# Patient Record
Sex: Female | Born: 1975
Health system: Southern US, Community
[De-identification: ages and names within clinical notes are randomized; demographics above are authoritative.]

## PROBLEM LIST (undated history)

## (undated) DIAGNOSIS — Z87442 Personal history of urinary calculi: Secondary | ICD-10-CM

## (undated) DIAGNOSIS — F329 Major depressive disorder, single episode, unspecified: Secondary | ICD-10-CM

## (undated) DIAGNOSIS — K219 Gastro-esophageal reflux disease without esophagitis: Secondary | ICD-10-CM

## (undated) DIAGNOSIS — Z8679 Personal history of other diseases of the circulatory system: Secondary | ICD-10-CM

## (undated) DIAGNOSIS — Z973 Presence of spectacles and contact lenses: Secondary | ICD-10-CM

## (undated) DIAGNOSIS — G43909 Migraine, unspecified, not intractable, without status migrainosus: Secondary | ICD-10-CM

## (undated) DIAGNOSIS — E559 Vitamin D deficiency, unspecified: Secondary | ICD-10-CM

## (undated) DIAGNOSIS — N201 Calculus of ureter: Secondary | ICD-10-CM

## (undated) DIAGNOSIS — K5909 Other constipation: Secondary | ICD-10-CM

## (undated) DIAGNOSIS — F32A Depression, unspecified: Secondary | ICD-10-CM

## (undated) DIAGNOSIS — R3915 Urgency of urination: Secondary | ICD-10-CM

---

## 2002-04-26 ENCOUNTER — Emergency Department (HOSPITAL_COMMUNITY): Admission: EM | Admit: 2002-04-26 | Discharge: 2002-04-26 | Payer: Self-pay | Admitting: Emergency Medicine

## 2004-04-14 ENCOUNTER — Emergency Department (HOSPITAL_COMMUNITY): Admission: EM | Admit: 2004-04-14 | Discharge: 2004-04-14 | Payer: Self-pay | Admitting: Family Medicine

## 2005-06-01 ENCOUNTER — Emergency Department (HOSPITAL_COMMUNITY): Admission: EM | Admit: 2005-06-01 | Discharge: 2005-06-01 | Payer: Self-pay | Admitting: Emergency Medicine

## 2006-12-25 ENCOUNTER — Emergency Department (HOSPITAL_COMMUNITY): Admission: EM | Admit: 2006-12-25 | Discharge: 2006-12-25 | Payer: Self-pay | Admitting: Family Medicine

## 2007-09-02 ENCOUNTER — Emergency Department (HOSPITAL_COMMUNITY): Admission: EM | Admit: 2007-09-02 | Discharge: 2007-09-02 | Payer: Self-pay | Admitting: Emergency Medicine

## 2008-12-17 ENCOUNTER — Emergency Department (HOSPITAL_COMMUNITY): Admission: EM | Admit: 2008-12-17 | Discharge: 2008-12-17 | Payer: Self-pay | Admitting: Emergency Medicine

## 2009-04-09 ENCOUNTER — Encounter: Admission: RE | Admit: 2009-04-09 | Discharge: 2009-04-09 | Payer: Self-pay | Admitting: Gastroenterology

## 2010-02-17 ENCOUNTER — Emergency Department (HOSPITAL_COMMUNITY): Admission: EM | Admit: 2010-02-17 | Discharge: 2010-02-17 | Payer: Self-pay | Admitting: Emergency Medicine

## 2010-10-14 ENCOUNTER — Emergency Department (HOSPITAL_COMMUNITY): Admission: EM | Admit: 2010-10-14 | Discharge: 2010-10-14 | Payer: Self-pay | Admitting: Emergency Medicine

## 2011-02-21 LAB — POCT I-STAT, CHEM 8
BUN: 4 mg/dL — ABNORMAL LOW (ref 6–23)
Calcium, Ion: 1.17 mmol/L (ref 1.12–1.32)
Chloride: 105 mEq/L (ref 96–112)
HCT: 42 % (ref 36.0–46.0)
Sodium: 139 mEq/L (ref 135–145)
TCO2: 21 mmol/L (ref 0–100)

## 2011-02-21 LAB — URINALYSIS, ROUTINE W REFLEX MICROSCOPIC
Bilirubin Urine: NEGATIVE
Glucose, UA: NEGATIVE mg/dL
Leukocytes, UA: NEGATIVE
Nitrite: NEGATIVE
Protein, ur: 30 mg/dL — AB
Specific Gravity, Urine: 1.023 (ref 1.005–1.030)
Urobilinogen, UA: 0.2 mg/dL (ref 0.0–1.0)
pH: 6 (ref 5.0–8.0)

## 2011-02-21 LAB — CBC
HCT: 36.6 % (ref 36.0–46.0)
Hemoglobin: 11.7 g/dL — ABNORMAL LOW (ref 12.0–15.0)
MCH: 23.4 pg — ABNORMAL LOW (ref 26.0–34.0)
MCHC: 32 g/dL (ref 30.0–36.0)
MCV: 73.1 fL — ABNORMAL LOW (ref 78.0–100.0)
Platelets: 368 10*3/uL (ref 150–400)
RBC: 5.01 MIL/uL (ref 3.87–5.11)
RDW: 14.8 % (ref 11.5–15.5)
WBC: 5.5 10*3/uL (ref 4.0–10.5)

## 2011-02-21 LAB — GC/CHLAMYDIA PROBE AMP, GENITAL
Chlamydia, DNA Probe: NEGATIVE
GC Probe Amp, Genital: NEGATIVE

## 2011-02-21 LAB — DIFFERENTIAL
Basophils Absolute: 0.1 10*3/uL (ref 0.0–0.1)
Basophils Relative: 1 % (ref 0–1)
Eosinophils Absolute: 0 10*3/uL (ref 0.0–0.7)
Eosinophils Relative: 0 % (ref 0–5)
Lymphocytes Relative: 19 % (ref 12–46)
Lymphs Abs: 1 10*3/uL (ref 0.7–4.0)
Monocytes Absolute: 0.4 10*3/uL (ref 0.1–1.0)
Monocytes Relative: 7 % (ref 3–12)
Neutro Abs: 4 10*3/uL (ref 1.7–7.7)
Neutrophils Relative %: 74 % (ref 43–77)

## 2011-02-21 LAB — WET PREP, GENITAL: Trich, Wet Prep: NONE SEEN

## 2011-02-21 LAB — URINE MICROSCOPIC-ADD ON

## 2011-03-06 LAB — COMPREHENSIVE METABOLIC PANEL
ALT: 18 U/L (ref 0–35)
AST: 25 U/L (ref 0–37)
Albumin: 4.3 g/dL (ref 3.5–5.2)
Alkaline Phosphatase: 39 U/L (ref 39–117)
CO2: 23 mEq/L (ref 19–32)
Chloride: 110 mEq/L (ref 96–112)
GFR calc Af Amer: >60 mL/min (ref >60)
GFR calc non Af Amer: >60 mL/min (ref >60)
Potassium: 3.7 mEq/L (ref 3.5–5.1)
Total Bilirubin: 0.7 mg/dL (ref 0.3–1.2)

## 2011-03-06 LAB — URINALYSIS, ROUTINE W REFLEX MICROSCOPIC
Bilirubin Urine: NEGATIVE
Ketones, ur: 15 mg/dL — AB
Protein, ur: 30 mg/dL — AB
Specific Gravity, Urine: 1.024 (ref 1.005–1.030)
Urobilinogen, UA: 1 mg/dL (ref 0.0–1.0)

## 2011-03-06 LAB — LIPASE, BLOOD: Lipase: 32 U/L (ref 11–59)

## 2011-03-06 LAB — URINE MICROSCOPIC-ADD ON

## 2011-03-06 LAB — DIFFERENTIAL
Basophils Absolute: 0 10*3/uL (ref 0.0–0.1)
Basophils Relative: 0 % (ref 0–1)
Eosinophils Absolute: 0 10*3/uL (ref 0.0–0.7)
Eosinophils Relative: 0 % (ref 0–5)
Monocytes Absolute: 0.2 10*3/uL (ref 0.1–1.0)

## 2011-03-06 LAB — CBC
MCV: 74.1 fL — ABNORMAL LOW (ref 78.0–100.0)
Platelets: 299 10*3/uL (ref 150–400)
RBC: 5 MIL/uL (ref 3.87–5.11)
WBC: 5.4 10*3/uL (ref 4.0–10.5)

## 2011-03-06 LAB — POCT PREGNANCY, URINE: Preg Test, Ur: NEGATIVE

## 2011-03-27 LAB — POCT I-STAT, CHEM 8
Creatinine, Ser: 1 mg/dL (ref 0.4–1.2)
Glucose, Bld: 104 mg/dL — ABNORMAL HIGH (ref 70–99)
HCT: 47 % — ABNORMAL HIGH (ref 36.0–46.0)
Hemoglobin: 16 g/dL — ABNORMAL HIGH (ref 12.0–15.0)
Sodium: 140 mEq/L (ref 135–145)
TCO2: 26 mmol/L (ref 0–100)

## 2011-05-28 ENCOUNTER — Emergency Department (HOSPITAL_COMMUNITY)
Admission: EM | Admit: 2011-05-28 | Discharge: 2011-05-28 | Disposition: A | Discharge status: Home / Self Care | Payer: 59 | Attending: Emergency Medicine | Admitting: Emergency Medicine

## 2011-05-28 DIAGNOSIS — R110 Nausea: Secondary | ICD-10-CM | POA: Insufficient documentation

## 2011-05-28 DIAGNOSIS — F3289 Other specified depressive episodes: Secondary | ICD-10-CM | POA: Insufficient documentation

## 2011-05-28 DIAGNOSIS — R51 Headache: Secondary | ICD-10-CM | POA: Insufficient documentation

## 2011-05-28 DIAGNOSIS — F329 Major depressive disorder, single episode, unspecified: Secondary | ICD-10-CM | POA: Insufficient documentation

## 2011-09-21 LAB — POCT URINALYSIS DIP (DEVICE)
Ketones, ur: NEGATIVE
Protein, ur: 30 — AB
Specific Gravity, Urine: >=1.03
pH: 6

## 2011-09-21 LAB — POCT PREGNANCY, URINE: Operator id: 116391

## 2012-01-02 ENCOUNTER — Other Ambulatory Visit: Payer: Self-pay | Admitting: Neurology

## 2012-01-02 DIAGNOSIS — R410 Disorientation, unspecified: Secondary | ICD-10-CM

## 2012-01-04 ENCOUNTER — Ambulatory Visit
Admission: RE | Admit: 2012-01-04 | Discharge: 2012-01-04 | Disposition: A | Discharge status: Home / Self Care | Payer: 59 | Source: Ambulatory Visit | Attending: Neurology | Admitting: Neurology

## 2012-01-04 ENCOUNTER — Other Ambulatory Visit: Payer: Self-pay | Admitting: Neurosurgery

## 2012-01-04 DIAGNOSIS — S066X9A Traumatic subarachnoid hemorrhage with loss of consciousness of unspecified duration, initial encounter: Secondary | ICD-10-CM

## 2012-01-04 DIAGNOSIS — R410 Disorientation, unspecified: Secondary | ICD-10-CM

## 2013-01-14 ENCOUNTER — Emergency Department (HOSPITAL_COMMUNITY): Payer: 59

## 2013-01-14 ENCOUNTER — Emergency Department (HOSPITAL_COMMUNITY)
Admission: EM | Admit: 2013-01-14 | Discharge: 2013-01-14 | Disposition: A | Discharge status: Home / Self Care | Payer: 59 | Attending: Emergency Medicine | Admitting: Emergency Medicine

## 2013-01-14 ENCOUNTER — Encounter (HOSPITAL_COMMUNITY): Payer: Self-pay | Admitting: Emergency Medicine

## 2013-01-14 DIAGNOSIS — R350 Frequency of micturition: Secondary | ICD-10-CM | POA: Insufficient documentation

## 2013-01-14 DIAGNOSIS — R300 Dysuria: Secondary | ICD-10-CM | POA: Insufficient documentation

## 2013-01-14 DIAGNOSIS — Z3202 Encounter for pregnancy test, result negative: Secondary | ICD-10-CM | POA: Insufficient documentation

## 2013-01-14 DIAGNOSIS — Z8679 Personal history of other diseases of the circulatory system: Secondary | ICD-10-CM | POA: Insufficient documentation

## 2013-01-14 DIAGNOSIS — R3915 Urgency of urination: Secondary | ICD-10-CM | POA: Insufficient documentation

## 2013-01-14 DIAGNOSIS — R011 Cardiac murmur, unspecified: Secondary | ICD-10-CM | POA: Insufficient documentation

## 2013-01-14 DIAGNOSIS — Z79899 Other long term (current) drug therapy: Secondary | ICD-10-CM | POA: Insufficient documentation

## 2013-01-14 DIAGNOSIS — N23 Unspecified renal colic: Secondary | ICD-10-CM | POA: Insufficient documentation

## 2013-01-14 HISTORY — DX: Migraine, unspecified, not intractable, without status migrainosus: G43.909

## 2013-01-14 LAB — CBC WITH DIFFERENTIAL/PLATELET
Basophils Absolute: 0 10*3/uL (ref 0.0–0.1)
HCT: 33.8 % — ABNORMAL LOW (ref 36.0–46.0)
Hemoglobin: 10.7 g/dL — ABNORMAL LOW (ref 12.0–15.0)
Lymphocytes Relative: 27 % (ref 12–46)
Monocytes Absolute: 0.2 10*3/uL (ref 0.1–1.0)
Monocytes Relative: 3 % (ref 3–12)
Neutro Abs: 3.8 10*3/uL (ref 1.7–7.7)
WBC: 5.5 10*3/uL (ref 4.0–10.5)

## 2013-01-14 LAB — COMPREHENSIVE METABOLIC PANEL
AST: 21 U/L (ref 0–37)
Alkaline Phosphatase: 40 U/L (ref 39–117)
BUN: 8 mg/dL (ref 6–23)
CO2: 25 mEq/L (ref 19–32)
Chloride: 99 mEq/L (ref 96–112)
Creatinine, Ser: 0.89 mg/dL (ref 0.50–1.10)
GFR calc non Af Amer: 84 mL/min — ABNORMAL LOW (ref >90)
Potassium: 3.9 mEq/L (ref 3.5–5.1)
Total Bilirubin: 0.7 mg/dL (ref 0.3–1.2)

## 2013-01-14 LAB — URINALYSIS, ROUTINE W REFLEX MICROSCOPIC
Glucose, UA: NEGATIVE mg/dL
pH: 6 (ref 5.0–8.0)

## 2013-01-14 LAB — POCT PREGNANCY, URINE: Preg Test, Ur: NEGATIVE

## 2013-01-14 LAB — URINE MICROSCOPIC-ADD ON

## 2013-01-14 MED ORDER — HYDROCODONE-ACETAMINOPHEN 5-325 MG PO TABS: 1.0000 | ORAL_TABLET | ORAL | Status: DC | PRN

## 2013-01-14 MED ORDER — ONDANSETRON HCL 4 MG/2ML IJ SOLN
4.0000 mg | Freq: Once | INTRAMUSCULAR | Status: AC
  Administered 2013-01-14: 4 mg via INTRAVENOUS
  Filled 2013-01-14: qty 2

## 2013-01-14 MED ORDER — MORPHINE SULFATE 4 MG/ML IJ SOLN
4.0000 mg | Freq: Once | INTRAMUSCULAR | Status: AC
  Administered 2013-01-14: 4 mg via INTRAVENOUS
  Filled 2013-01-14: qty 1

## 2013-01-14 MED ORDER — KETOROLAC TROMETHAMINE 30 MG/ML IJ SOLN
30.0000 mg | Freq: Once | INTRAMUSCULAR | Status: AC
  Administered 2013-01-14: 30 mg via INTRAVENOUS
  Filled 2013-01-14: qty 1

## 2013-01-14 MED ORDER — KETOROLAC TROMETHAMINE 10 MG PO TABS: 10.0000 mg | ORAL_TABLET | Freq: Four times a day (QID) | ORAL | Status: DC | PRN

## 2013-01-14 MED ORDER — SODIUM CHLORIDE 0.9 % IV BOLUS (SEPSIS)
1000.0000 mL | Freq: Once | INTRAVENOUS | Status: AC
  Administered 2013-01-14: 1000 mL via INTRAVENOUS

## 2013-01-14 MED ORDER — ONDANSETRON HCL 4 MG PO TABS: 4.0000 mg | ORAL_TABLET | Freq: Four times a day (QID) | ORAL | Status: DC

## 2013-01-14 MED ORDER — PROMETHAZINE HCL 25 MG/ML IJ SOLN
25.0000 mg | Freq: Once | INTRAMUSCULAR | Status: AC
  Administered 2013-01-14: 25 mg via INTRAVENOUS
  Filled 2013-01-14: qty 1

## 2013-01-14 MED ORDER — TAMSULOSIN HCL 0.4 MG PO CAPS: 0.4000 mg | ORAL_CAPSULE | Freq: Every day | ORAL | Status: DC

## 2013-01-14 NOTE — ED Provider Notes (Signed)
History  This chart was scribed for Cassandra Racer, MD by Shari Heritage, ED Scribe. The patient was seen in room WA08/WA08. Patient's care was started at 67.   CSN: [id number]  Arrival date & time 01/14/13  1932   First MD Initiated Contact with Patient 01/14/13 1953      Chief Complaint  Patient presents with  . Abdominal Pain    Patient is a 37 y.o. female presenting with abdominal pain. The history is provided by the patient. No language interpreter was used.  Abdominal Pain The primary symptoms of the illness include abdominal pain and dysuria. The primary symptoms of the illness do not include vaginal discharge or vaginal bleeding. The current episode started 1 to 2 hours ago. The onset of the illness was sudden. The problem has not changed since onset. The abdominal pain began 1 to 2 hours ago. The pain came on suddenly. The abdominal pain has been unchanged since its onset. The abdominal pain is located in the RLQ and RUQ. The abdominal pain radiates to the groin.   The dysuria began 2 days ago. The dysuria is associated with frequency and urgency. The dysuria is not associated with hematuria.   Additional symptoms associated with the illness include urgency and frequency. Symptoms associated with the illness do not include hematuria.    HPI Comments: Cassandra Mcdaniel is a 37 y.o. female who presents to the Emergency Department complaining of acute onset, constant, moderate to severe, sharp right-sided abdominal pain that radiates to groin. The pain began 2.5 hours ago. Patient also reports associate symptoms of dysuria, urinary frequency and urgency onset 2 days ago. She states that Fentanyl was given en route which improved pain significantly for a short time, but now pain has returned. Patient denies hematuria, vaginal bleeding or vaginal discharge. LMP was 01/08/2013. Patient has a history of kidney stones and has had similar severe abdominal and flank tenderness to palpation. She  says that she has been able to pass stones in the past. No history of appendectomy.   Past Medical History  Diagnosis Date  . Migraine   . Heart murmur     History reviewed. No pertinent past surgical history.  No family history on file.  History  Substance Use Topics  . Smoking status: Never Smoker   . Smokeless tobacco: Never Used  . Alcohol Use: Yes     Comment: Ocassionally     OB History    Grav Para Term Preterm Abortions TAB SAB Ect Mult Living                  Review of Systems  Gastrointestinal: Positive for abdominal pain.  Genitourinary: Positive for dysuria, urgency and frequency. Negative for hematuria, vaginal bleeding and vaginal discharge.  All other systems reviewed and are negative.    Allergies  Review of patient's allergies indicates no known allergies.  Home Medications   Current Outpatient Rx  Name  Route  Sig  Dispense  Refill  . SERTRALINE HCL 100 MG PO TABS   Oral   Take 100 mg by mouth every morning.         Marland Kitchen TEMAZEPAM 30 MG PO CAPS   Oral   Take 30 mg by mouth at bedtime.         . TRAZODONE HCL 100 MG PO TABS   Oral   Take 100 mg by mouth at bedtime.         Marland Kitchen HYDROCODONE-ACETAMINOPHEN 5-325 MG PO TABS  Oral   Take 1 tablet by mouth every 4 (four) hours as needed for pain.   15 tablet   0   . KETOROLAC TROMETHAMINE 10 MG PO TABS   Oral   Take 1 tablet (10 mg total) by mouth every 6 (six) hours as needed for pain.   20 tablet   0   . ONDANSETRON HCL 4 MG PO TABS   Oral   Take 1 tablet (4 mg total) by mouth every 6 (six) hours.   12 tablet   0   . TAMSULOSIN HCL 0.4 MG PO CAPS   Oral   Take 1 capsule (0.4 mg total) by mouth daily.   30 capsule   0     Triage Vitals: BP 130/76  Pulse 63  Temp 98 F (36.7 C)  Resp 20  SpO2 100%  Physical Exam  Constitutional: She is oriented to person, place, and time. She appears well-developed and well-nourished.  HENT:  Head: Normocephalic and atraumatic.   Mouth/Throat: Oropharynx is clear and moist.  Eyes: Conjunctivae normal and EOM are normal. Pupils are equal, round, and reactive to light.  Neck: Normal range of motion. Neck supple.  Cardiovascular: Normal rate, regular rhythm and normal heart sounds.   Pulmonary/Chest: Effort normal and breath sounds normal.  Abdominal: Soft. Bowel sounds are normal. She exhibits no distension. There is tenderness in the right upper quadrant and right lower quadrant. There is no rebound, no guarding and no CVA tenderness.  Musculoskeletal: Normal range of motion.  Neurological: She is alert and oriented to person, place, and time.  Skin: Skin is warm and dry.  Psychiatric: She has a normal mood and affect. Her behavior is normal.    ED Course  Procedures (including critical care time) DIAGNOSTIC STUDIES: Oxygen Saturation is 100% on room air, normal by my interpretation.    COORDINATION OF CARE: 8:12 PM- Patient informed of current plan for treatment and evaluation and agrees with plan at this time.   10:39PM- Patient is comfortable after IV fluids, Phenergan, morphine and Toradol. CT shows 3 mm stone along posterior aspect of bladder right of midline. Patient advised to follow up with urology. She verbalizes understanding and agrees.    Labs Reviewed  URINALYSIS, ROUTINE W REFLEX MICROSCOPIC - Abnormal; Notable for the following:    Hgb urine dipstick MODERATE (*)     Bilirubin Urine MODERATE (*)     Ketones, ur TRACE (*)     Leukocytes, UA TRACE (*)     All other components within normal limits  CBC WITH DIFFERENTIAL - Abnormal; Notable for the following:    Hemoglobin 10.7 (*)     HCT 33.8 (*)     MCV 72.7 (*)     MCH 23.0 (*)     All other components within normal limits  COMPREHENSIVE METABOLIC PANEL - Abnormal; Notable for the following:    Sodium 134 (*)     Glucose, Bld 104 (*)     GFR calc non Af Amer 84 (*)     All other components within normal limits  LIPASE, BLOOD  POCT  PREGNANCY, URINE  URINE MICROSCOPIC-ADD ON  LAB REPORT - SCANNED    No results found.   1. Ureteral colic       MDM  I personally performed the services described in this documentation, which was scribed in my presence. The recorded information has been reviewed and is accurate.    Cassandra Racer, MD 01/17/13 1745

## 2013-01-14 NOTE — ED Notes (Signed)
Per EMS: Pt c/o of right sided 10/10 flank and right lower abdominal pain. Pt reports urinary urgency, however reports difficulty urinating. 150 mcgs of Fentanyl given in route by EMS.

## 2013-01-14 NOTE — ED Notes (Signed)
rx x 4 given for zofran, flomax, hydrocodone and toradol- pt has a ride at bedside

## 2013-01-14 NOTE — ED Notes (Signed)
Bed:WA08<BR> Expected date:<BR> Expected time:<BR> Means of arrival:<BR> Comments:<BR> EMS

## 2013-01-14 NOTE — ED Notes (Signed)
Pt states still does not have the urge to void will check back in 30 mins

## 2013-01-30 ENCOUNTER — Other Ambulatory Visit: Payer: Self-pay | Admitting: Family Medicine

## 2013-01-30 ENCOUNTER — Ambulatory Visit
Admission: RE | Admit: 2013-01-30 | Discharge: 2013-01-30 | Disposition: A | Discharge status: Home / Self Care | Payer: 59 | Source: Ambulatory Visit | Attending: Family Medicine | Admitting: Family Medicine

## 2013-01-30 DIAGNOSIS — R109 Unspecified abdominal pain: Secondary | ICD-10-CM

## 2014-01-05 ENCOUNTER — Ambulatory Visit
Admission: RE | Admit: 2014-01-05 | Discharge: 2014-01-05 | Disposition: A | Discharge status: Home / Self Care | Payer: 59 | Source: Ambulatory Visit | Attending: Family Medicine | Admitting: Family Medicine

## 2014-01-05 ENCOUNTER — Other Ambulatory Visit: Payer: Self-pay | Admitting: Family Medicine

## 2014-01-05 DIAGNOSIS — R1031 Right lower quadrant pain: Secondary | ICD-10-CM

## 2014-01-19 ENCOUNTER — Encounter (HOSPITAL_BASED_OUTPATIENT_CLINIC_OR_DEPARTMENT_OTHER): Payer: Self-pay | Admitting: *Deleted

## 2014-01-19 ENCOUNTER — Other Ambulatory Visit: Payer: Self-pay | Admitting: Urology

## 2014-01-19 NOTE — Progress Notes (Signed)
NPO AFTER MN WITH EXCEPTION CLEAR LIQUIDS UNTIL 0700 (NO CREAM/ MILK PRODUCTS). ARRIVE AT 1145. NEEDS HG AND URINE PREG. WILL TAKE NADOLOL AND BRINTELLIX AM DOS W/ SIPS OF WATER. IF NEEDED MAY TAKE PRN MEDS W/ SIPS OF WATER.

## 2014-01-20 NOTE — H&P (Signed)
History of Present Illness   Ms. Cassandra Mcdaniel presents today as a referral from Dr. Renaye RakersVeita Bland and also to reestablish as a new patient. I saw her on one occasion in 2011 after she had spontaneously passed her first ureteral calculus. At that time she had no family history of nephrolithiasis and that was her first clinical stone event. She had spontaneously passed a 3 mm stone and she had no other renal calculi at that time. Stone analysis turned out to be calcium oxalate and we made some general recommendations about stone prevention. Unfortunately, she has continued to have some intermittent stones and believes she has passed an additional 3-4 stones since that visit. More recently she developed some significant right-sided renal colic along with significant nausea.  A stone protocol CT scan was performed on January 05, 2014. This showed fairly significant right-sided hydronephrosis and a large stone/cluster of stones in her distal right ureter. The lead stone appeared to be about 7 mm in size. In addition, she has a couple of very small stones in both kidneys, none that appear to be bigger than 2 mm. She has continued to have discomfort as well as some nausea. Urinalysis fortunately does not show anything of concern.      Past Medical History Problems  1. History of Anxiety (300.00) 2. History of cardiac murmur (V12.59) 3. History of depression (V11.8) 4. History of gastroesophageal reflux (GERD) (V12.79) 5. History of hypercholesterolemia (V12.29) 6. History of migraine headaches (V12.49)  Surgical History Problems  1. History of No Surgical Problems  Current Meds 1. Ambien TABS;  Therapy: (Recorded:15Nov2011) to Recorded 2. Atenolol TABS;  Therapy: (Recorded:15Nov2011) to Recorded 3. Brintellix 20 MG Oral Tablet;  Therapy: (Recorded:06Feb2015) to Recorded 4. Folic Acid TABS;  Therapy: (Recorded:06Feb2015) to Recorded 5. Nadolol 80 MG Oral Tablet;  Therapy: (Recorded:06Feb2015) to  Recorded 6. TiZANidine HCl - 4 MG Oral Tablet;  Therapy: (Recorded:06Feb2015) to Recorded 7. TiZANidine HCl TABS;  Therapy: (Recorded:15Nov2011) to Recorded 8. Zoloft TABS;  Therapy: (Recorded:15Nov2011) to Recorded  Allergies Medication  1. Imitrex  Family History Problems  1. No pertinent family history : Mother  Social History Problems    Alcohol use   Caffeine use (V49.89)   Never a smoker (V49.89)   Single  Review of Systems Genitourinary, constitutional, skin, eye, otolaryngeal, hematologic/lymphatic, cardiovascular, pulmonary, endocrine, musculoskeletal, gastrointestinal, neurological and psychiatric system(s) were reviewed and pertinent findings if present are noted.  Genitourinary: urinary frequency and nocturia.  Gastrointestinal: nausea, flank pain, abdominal pain and constipation.  Constitutional: feeling tired (fatigue).  Integumentary: skin rash/lesion and pruritus.  Hematologic/Lymphatic: a tendency to easily bruise.  Neurological: dizziness and headache.  Psychiatric: depression and anxiety.    Vitals Vital Signs [Data Includes: Last 1 Day]  Recorded: 06Feb2015 02:55PM  Height: 5 ft 1.5 in Weight: 138 lb  BMI Calculated: 25.65 BSA Calculated: 1.62 Blood Pressure: 111 / 72 Temperature: 97 F Heart Rate: 57  Physical Exam Constitutional: Well nourished and well developed . No acute distress.  ENT:. The ears and nose are normal in appearance.  Neck: The appearance of the neck is normal and no neck mass is present.  Pulmonary: No respiratory distress and normal respiratory rhythm and effort.  Cardiovascular: Heart rate and rhythm are normal . No peripheral edema.  Abdomen: The abdomen is soft and nontender. No CVA tenderness.  Skin: Normal skin turgor, no visible rash and no visible skin lesions.  Neuro/Psych:. Mood and affect are appropriate.    Results/Data Urine [Data Includes: Last  1 Day]   06Feb2015  COLOR YELLOW   APPEARANCE CLEAR    SPECIFIC GRAVITY 1.025   pH 6.0   GLUCOSE NEG mg/dL  BILIRUBIN NEG   KETONE NEG mg/dL  BLOOD TRACE   PROTEIN NEG mg/dL  UROBILINOGEN 0.2 mg/dL  NITRITE NEG   LEUKOCYTE ESTERASE NEG   SQUAMOUS EPITHELIAL/HPF FEW   WBC 3-6 WBC/hpf  RBC 0-2 RBC/hpf  BACTERIA FEW   CRYSTALS NONE SEEN   CASTS Hyaline casts noted   Other MUCUS NOTED    Assessment Assessed  1. Calculus of ureter (592.1) 2. Nephrolithiasis (592.0) 3. Pyuria (791.9)  Plan Health Maintenance  1. UA With REFLEX; [Do Not Release]; Status:Complete;   Done: 06Feb2015 02:41PM Nephrolithiasis  2. Start: Ciprofloxacin HCl - 250 MG Oral Tablet; Take 1 tablet daily 3. Start: Fluconazole 150 MG Oral Tablet; TAKE 1 TABLET 1 TIME ONLY 4. Start: Tamsulosin HCl - 0.4 MG Oral Capsule; TAKE 1 CAPSULE EVERY DAY 5. Follow-up Schedule Surgery Office  Follow-up  Status: Complete  Done: 06Feb2015  Discussion/Summary   Cassandra Mcdaniel has a 7 mm distal right ureteral calculus/possible calculi. She did develop some increased bladder symptoms 2 or 3 weeks ago and I suspect she has had this stone for a number of weeks. Because of ongoing intermittent nausea and pain, she is interested in having something done definitive, and I think that makes a lot of sense in this situation. Given her situation, I would recommend ureteroscopy with Holmium laser lithotripsy. That procedure, risks, benefits discussed with her. We will attempt to try to get her on the schedule for next week. I would anticipate probable need for double-J stent for a few days after the procedure. The chances of success should be 95%. I am going to put her on a one-a-day antibiotic to prevent infection, given the degree of hydronephrosis of the right kidney. We will put her on tamsulosin in hopes that she can pass the stone spontaneously, but I think her chances are probably no better than 20-30%. She has had stone analysis in the past, but I think given her young age and now recurrent  stone, she really would benefit from a 48 hour metabolic panel/profile. We will set that up once the acute stone event has been dealt with successfully.   cc: Renaye Rakers, MD     Signatures Electronically signed by : Barron Alvine, M.D.; Jan 16 2014  4:38PM EST

## 2014-01-21 ENCOUNTER — Encounter (HOSPITAL_BASED_OUTPATIENT_CLINIC_OR_DEPARTMENT_OTHER): Payer: Self-pay | Admitting: Anesthesiology

## 2014-01-21 ENCOUNTER — Encounter (HOSPITAL_BASED_OUTPATIENT_CLINIC_OR_DEPARTMENT_OTHER): Payer: 59 | Admitting: Anesthesiology

## 2014-01-21 ENCOUNTER — Encounter (HOSPITAL_BASED_OUTPATIENT_CLINIC_OR_DEPARTMENT_OTHER)
Admission: RE | Disposition: A | Discharge status: Home / Self Care | Payer: Self-pay | Source: Ambulatory Visit | Attending: Urology

## 2014-01-21 ENCOUNTER — Ambulatory Visit (HOSPITAL_BASED_OUTPATIENT_CLINIC_OR_DEPARTMENT_OTHER)
Admission: RE | Admit: 2014-01-21 | Discharge: 2014-01-21 | Disposition: A | Discharge status: Home / Self Care | Payer: 59 | Source: Ambulatory Visit | Attending: Urology | Admitting: Urology

## 2014-01-21 ENCOUNTER — Ambulatory Visit (HOSPITAL_BASED_OUTPATIENT_CLINIC_OR_DEPARTMENT_OTHER): Payer: 59 | Admitting: Anesthesiology

## 2014-01-21 DIAGNOSIS — N200 Calculus of kidney: Secondary | ICD-10-CM | POA: Insufficient documentation

## 2014-01-21 DIAGNOSIS — R82998 Other abnormal findings in urine: Secondary | ICD-10-CM | POA: Insufficient documentation

## 2014-01-21 DIAGNOSIS — N201 Calculus of ureter: Secondary | ICD-10-CM | POA: Insufficient documentation

## 2014-01-21 DIAGNOSIS — Z79899 Other long term (current) drug therapy: Secondary | ICD-10-CM | POA: Insufficient documentation

## 2014-01-21 DIAGNOSIS — E7800 Pure hypercholesterolemia, unspecified: Secondary | ICD-10-CM | POA: Insufficient documentation

## 2014-01-21 DIAGNOSIS — K219 Gastro-esophageal reflux disease without esophagitis: Secondary | ICD-10-CM | POA: Insufficient documentation

## 2014-01-21 HISTORY — DX: Presence of spectacles and contact lenses: Z97.3

## 2014-01-21 HISTORY — DX: Major depressive disorder, single episode, unspecified: F32.9

## 2014-01-21 HISTORY — DX: Personal history of urinary calculi: Z87.442

## 2014-01-21 HISTORY — PX: HOLMIUM LASER APPLICATION: SHX5852

## 2014-01-21 HISTORY — DX: Depression, unspecified: F32.A

## 2014-01-21 HISTORY — DX: Vitamin D deficiency, unspecified: E55.9

## 2014-01-21 HISTORY — PX: CYSTOSCOPY WITH RETROGRADE PYELOGRAM, URETEROSCOPY AND STENT PLACEMENT: SHX5789

## 2014-01-21 HISTORY — DX: Urgency of urination: R39.15

## 2014-01-21 HISTORY — DX: Calculus of ureter: N20.1

## 2014-01-21 HISTORY — DX: Gastro-esophageal reflux disease without esophagitis: K21.9

## 2014-01-21 HISTORY — DX: Personal history of other diseases of the circulatory system: Z86.79

## 2014-01-21 HISTORY — DX: Other constipation: K59.09

## 2014-01-21 LAB — POCT PREGNANCY, URINE: PREG TEST UR: NEGATIVE

## 2014-01-21 LAB — POCT HEMOGLOBIN-HEMACUE: Hemoglobin: 10 g/dL — ABNORMAL LOW (ref 12.0–15.0)

## 2014-01-21 SURGERY — CYSTOURETEROSCOPY, WITH RETROGRADE PYELOGRAM AND STENT INSERTION
Anesthesia: General | Site: Ureter | Laterality: Right

## 2014-01-21 MED ORDER — NITROFURANTOIN MACROCRYSTAL 100 MG PO CAPS: 100.0000 mg | ORAL_CAPSULE | Freq: Every day | ORAL | Status: DC

## 2014-01-21 MED ORDER — PROPOFOL 10 MG/ML IV BOLUS
INTRAVENOUS | Status: DC | PRN
  Administered 2014-01-21: 160 mg via INTRAVENOUS

## 2014-01-21 MED ORDER — FENTANYL CITRATE 0.05 MG/ML IJ SOLN
INTRAMUSCULAR | Status: AC
  Filled 2014-01-21: qty 2

## 2014-01-21 MED ORDER — LACTATED RINGERS IV SOLN
INTRAVENOUS | Status: DC
  Administered 2014-01-21: 12:00:00 via INTRAVENOUS
  Filled 2014-01-21: qty 1000

## 2014-01-21 MED ORDER — CEFAZOLIN SODIUM 1-5 GM-% IV SOLN
INTRAVENOUS | Status: AC
  Filled 2014-01-21: qty 50

## 2014-01-21 MED ORDER — URELLE 81 MG PO TABS
ORAL_TABLET | ORAL | Status: AC
  Filled 2014-01-21: qty 1

## 2014-01-21 MED ORDER — IOHEXOL 350 MG/ML SOLN
INTRAVENOUS | Status: DC | PRN
  Administered 2014-01-21: 5 mL

## 2014-01-21 MED ORDER — CEFAZOLIN SODIUM-DEXTROSE 2-3 GM-% IV SOLR
2.0000 g | INTRAVENOUS | Status: AC
  Administered 2014-01-21: 2 g via INTRAVENOUS
  Filled 2014-01-21: qty 50

## 2014-01-21 MED ORDER — FENTANYL CITRATE 0.05 MG/ML IJ SOLN
INTRAMUSCULAR | Status: DC | PRN
  Administered 2014-01-21: 50 ug via INTRAVENOUS

## 2014-01-21 MED ORDER — ACETAMINOPHEN 10 MG/ML IV SOLN
INTRAVENOUS | Status: DC | PRN
  Administered 2014-01-21: 1000 mg via INTRAVENOUS

## 2014-01-21 MED ORDER — URELLE 81 MG PO TABS
1.0000 | ORAL_TABLET | Freq: Four times a day (QID) | ORAL | Status: DC
  Administered 2014-01-21: 81 mg via ORAL
  Filled 2014-01-21: qty 1

## 2014-01-21 MED ORDER — HYDROCODONE-ACETAMINOPHEN 5-325 MG PO TABS: 1.0000 | ORAL_TABLET | Freq: Four times a day (QID) | ORAL | Status: DC | PRN

## 2014-01-21 MED ORDER — URIBEL 118 MG PO CAPS: 1.0000 | ORAL_CAPSULE | Freq: Three times a day (TID) | ORAL | Status: DC | PRN

## 2014-01-21 MED ORDER — DEXAMETHASONE SODIUM PHOSPHATE 4 MG/ML IJ SOLN
INTRAMUSCULAR | Status: DC | PRN
  Administered 2014-01-21: 10 mg via INTRAVENOUS

## 2014-01-21 MED ORDER — FENTANYL CITRATE 0.05 MG/ML IJ SOLN
25.0000 ug | INTRAMUSCULAR | Status: DC | PRN
  Filled 2014-01-21: qty 1

## 2014-01-21 MED ORDER — CEFAZOLIN SODIUM 1-5 GM-% IV SOLN
1.0000 g | INTRAVENOUS | Status: DC
  Filled 2014-01-21: qty 50

## 2014-01-21 MED ORDER — MIDAZOLAM HCL 5 MG/5ML IJ SOLN
INTRAMUSCULAR | Status: DC | PRN
  Administered 2014-01-21: 2 mg via INTRAVENOUS

## 2014-01-21 MED ORDER — MIDAZOLAM HCL 2 MG/2ML IJ SOLN
INTRAMUSCULAR | Status: AC
  Filled 2014-01-21: qty 2

## 2014-01-21 MED ORDER — ONDANSETRON HCL 4 MG/2ML IJ SOLN
INTRAMUSCULAR | Status: DC | PRN
  Administered 2014-01-21: 4 mg via INTRAVENOUS

## 2014-01-21 MED ORDER — KETOROLAC TROMETHAMINE 30 MG/ML IJ SOLN
INTRAMUSCULAR | Status: DC | PRN
  Administered 2014-01-21: 30 mg via INTRAVENOUS

## 2014-01-21 MED ORDER — BELLADONNA ALKALOIDS-OPIUM 16.2-60 MG RE SUPP
RECTAL | Status: AC
  Filled 2014-01-21: qty 1

## 2014-01-21 MED ORDER — LIDOCAINE HCL (CARDIAC) 20 MG/ML IV SOLN
INTRAVENOUS | Status: DC | PRN
  Administered 2014-01-21: 60 mg via INTRAVENOUS

## 2014-01-21 MED ORDER — SODIUM CHLORIDE 0.9 % IR SOLN
Status: DC | PRN
  Administered 2014-01-21: 2000 mL

## 2014-01-21 MED ORDER — BELLADONNA ALKALOIDS-OPIUM 16.2-60 MG RE SUPP
RECTAL | Status: DC | PRN
  Administered 2014-01-21: 1 via RECTAL

## 2014-01-21 MED ORDER — HYDROCODONE-ACETAMINOPHEN 5-325 MG PO TABS
1.0000 | ORAL_TABLET | Freq: Four times a day (QID) | ORAL | Status: DC | PRN
  Administered 2014-01-21: 1 via ORAL
  Filled 2014-01-21: qty 2

## 2014-01-21 MED ORDER — LACTATED RINGERS IV SOLN
INTRAVENOUS | Status: DC
Start: 2014-01-21 — End: 2014-01-21
  Administered 2014-01-21: 15:00:00 via INTRAVENOUS
  Filled 2014-01-21: qty 1000

## 2014-01-21 MED ORDER — LIDOCAINE HCL 2 % EX GEL
CUTANEOUS | Status: DC | PRN
  Administered 2014-01-21: 1 via URETHRAL

## 2014-01-21 SURGICAL SUPPLY — 46 items
ADAPTER CATH URET PLST 4-6FR (CATHETERS) IMPLANT
ADPR CATH URET STRL DISP 4-6FR (CATHETERS)
APL SKNCLS STERI-STRIP NONHPOA (GAUZE/BANDAGES/DRESSINGS)
BAG DRAIN URO-CYSTO SKYTR STRL (DRAIN) ×2 IMPLANT
BAG DRN UROCATH (DRAIN) ×1
BASKET LASER NITINOL 1.9FR (BASKET) IMPLANT
BASKET STNLS GEMINI 4WIRE 3FR (BASKET) IMPLANT
BASKET ZERO TIP NITINOL 2.4FR (BASKET) ×2 IMPLANT
BENZOIN TINCTURE PRP APPL 2/3 (GAUZE/BANDAGES/DRESSINGS) IMPLANT
BSKT STON RTRVL 120 1.9FR (BASKET)
BSKT STON RTRVL GEM 120X11 3FR (BASKET)
BSKT STON RTRVL ZERO TP 2.4FR (BASKET) ×1
CANISTER SUCT LVC 12 LTR MEDI- (MISCELLANEOUS) ×1 IMPLANT
CATH INTERMIT  6FR 70CM (CATHETERS) ×1 IMPLANT
CATH URET 5FR 28IN CONE TIP (BALLOONS)
CATH URET 5FR 28IN OPEN ENDED (CATHETERS) IMPLANT
CATH URET 5FR 70CM CONE TIP (BALLOONS) IMPLANT
CLOTH BEACON ORANGE TIMEOUT ST (SAFETY) ×2 IMPLANT
DRAPE CAMERA CLOSED 9X96 (DRAPES) ×2 IMPLANT
DRSG TEGADERM 2-3/8X2-3/4 SM (GAUZE/BANDAGES/DRESSINGS) ×1 IMPLANT
FIBER LASER FLEXIVA 1000 (UROLOGICAL SUPPLIES) IMPLANT
FIBER LASER FLEXIVA 200 (UROLOGICAL SUPPLIES) IMPLANT
FIBER LASER FLEXIVA 365 (UROLOGICAL SUPPLIES) ×1 IMPLANT
FIBER LASER FLEXIVA 550 (UROLOGICAL SUPPLIES) IMPLANT
GLOVE BIO SURGEON STRL SZ7.5 (GLOVE) ×2 IMPLANT
GOWN STRL REIN XL XLG (GOWN DISPOSABLE) ×2 IMPLANT
GOWN STRL REUS W/ TWL LRG LVL3 (GOWN DISPOSABLE) IMPLANT
GOWN STRL REUS W/ TWL XL LVL3 (GOWN DISPOSABLE) IMPLANT
GOWN STRL REUS W/TWL LRG LVL3 (GOWN DISPOSABLE) ×2
GOWN STRL REUS W/TWL XL LVL3 (GOWN DISPOSABLE) ×2
GUIDEWIRE 0.038 PTFE COATED (WIRE) IMPLANT
GUIDEWIRE ANG ZIPWIRE 038X150 (WIRE) IMPLANT
GUIDEWIRE STR DUAL SENSOR (WIRE) ×1 IMPLANT
IV NS 1000ML (IV SOLUTION) ×4
IV NS 1000ML BAXH (IV SOLUTION) IMPLANT
IV NS IRRIG 3000ML ARTHROMATIC (IV SOLUTION) ×2 IMPLANT
KIT BALLIN UROMAX 15FX10 (LABEL) IMPLANT
KIT BALLN UROMAX 15FX4 (MISCELLANEOUS) IMPLANT
KIT BALLN UROMAX 26 75X4 (MISCELLANEOUS)
NS IRRIG 500ML POUR BTL (IV SOLUTION) IMPLANT
PACK CYSTOSCOPY (CUSTOM PROCEDURE TRAY) ×2 IMPLANT
SET HIGH PRES BAL DIL (LABEL)
SHEATH ACCESS URETERAL 38CM (SHEATH) IMPLANT
SHEATH ACCESS URETERAL 54CM (SHEATH) IMPLANT
SHEATH URET ACCESS 12FR/35CM (UROLOGICAL SUPPLIES) ×1 IMPLANT
STENT 6X24 (STENTS) ×1 IMPLANT

## 2014-01-21 NOTE — Anesthesia Postprocedure Evaluation (Signed)
  Anesthesia Post-op Note  Patient: Cassandra Mcdaniel  Procedure(s) Performed: Procedure(s) (LRB): CYSTOSCOPY WITH RETROGRADE PYELOGRAM, URETEROSCOPY AND  STENT PLACEMENT (Right) HOLMIUM LASER APPLICATION (Right)  Patient Location: PACU  Anesthesia Type: General  Level of Consciousness: awake and alert   Airway and Oxygen Therapy: Patient Spontanous Breathing  Post-op Pain: mild  Post-op Assessment: Post-op Vital signs reviewed, Patient's Cardiovascular Status Stable, Respiratory Function Stable, Patent Airway and No signs of Nausea or vomiting  Last Vitals:  Filed Vitals:   01/21/14 1608  BP: 118/74  Pulse: 64  Temp: 36 C  Resp: 16    Post-op Vital Signs: stable   Complications: No apparent anesthesia complications

## 2014-01-21 NOTE — Transfer of Care (Signed)
Immediate Anesthesia Transfer of Care Note  Patient: Cassandra PartridgeQuiana N Mcdaniel  Procedure(s) Performed: Procedure(s): CYSTOSCOPY WITH RETROGRADE PYELOGRAM, URETEROSCOPY AND POSSIBLE STENT PLACEMENT (Right) HOLMIUM LASER APPLICATION (Right)  Patient Location: PACU  Anesthesia Type:General  Level of Consciousness: sedated and responds to stimulation  Airway & Oxygen Therapy: Patient Spontanous Breathing and Patient connected to nasal cannula oxygen  Post-op Assessment: Report given to PACU RN  Post vital signs: Reviewed and stable  Complications: No apparent anesthesia complications

## 2014-01-21 NOTE — Anesthesia Preprocedure Evaluation (Addendum)
Anesthesia Evaluation  Patient identified by MRN, date of birth, ID band Patient awake    Reviewed: Allergy & Precautions, H&P , NPO status , Patient's Chart, lab work & pertinent test results  Airway Mallampati: II  TM Distance: >3 FB Neck ROM: full    Dental no notable dental hx. (+) Teeth Intact, Dental Advisory Given   Pulmonary neg pulmonary ROS,  breath sounds clear to auscultation  Pulmonary exam normal       Cardiovascular Exercise Tolerance: Good negative cardio ROS  Rhythm:regular Rate:Normal     Neuro/Psych negative neurological ROS  negative psych ROS   GI/Hepatic negative GI ROS, Neg liver ROS,   Endo/Other  negative endocrine ROS  Renal/GU negative Renal ROS  negative genitourinary   Musculoskeletal   Abdominal   Peds  Hematology negative hematology ROS (+)   Anesthesia Other Findings   Reproductive/Obstetrics negative OB ROS                             Anesthesia Physical Anesthesia Plan  ASA: I  Anesthesia Plan: General   Post-op Pain Management:    Induction: Intravenous  Airway Management Planned: LMA  Additional Equipment:   Intra-op Plan:   Post-operative Plan:   Informed Consent: I have reviewed the patients History and Physical, chart, labs and discussed the procedure including the risks, benefits and alternatives for the proposed anesthesia with the patient or authorized representative who has indicated his/her understanding and acceptance.   Dental Advisory Given  Plan Discussed with: CRNA and Surgeon  Anesthesia Plan Comments:         Anesthesia Quick Evaluation  

## 2014-01-21 NOTE — Anesthesia Procedure Notes (Signed)
Procedure Name: LMA Insertion Date/Time: 01/21/2014 1:28 PM Performed by: Cassandra Mcdaniel, Cassandra Mcdaniel Pre-anesthesia Checklist: Patient identified, Emergency Drugs available, Suction available and Patient being monitored Patient Re-evaluated:Patient Re-evaluated prior to inductionOxygen Delivery Method: Circle System Utilized Preoxygenation: Pre-oxygenation with 100% oxygen Intubation Type: IV induction Ventilation: Mask ventilation without difficulty LMA: LMA inserted LMA Size: 4.0 Number of attempts: 1 Airway Equipment and Method: bite block Placement Confirmation: positive ETCO2 Dental Injury: Teeth and Oropharynx as per pre-operative assessment

## 2014-01-21 NOTE — Op Note (Signed)
Preoperative diagnosis: right ureteral calculus  Postoperative diagnosis: right ureteral calculus  Procedure:  1. Cystoscopy 2. right  ureteroscopy and stone removal 3. Ureteroscopic laser lithotripsy 4. right  ureteral stent placement (24) 42F 5. right  retrograde pyelography with interpretation  Surgeon: Valetta Fulleravid S. Winna Golla, MD  Anesthesia: General  Complications: None  Intraoperative findings: right  retrograde pyelography demonstrated a filling defect within the right  ureter consistent with the patient's known calculus without other abnormalities.  EBL: Minimal  Specimens: 1. right  ureteral calculus  Disposition of specimens: [facility] for stone analysis  Indication: Cassandra Mcdaniel is a 38 y.o.   patient with urolithiasis. After reviewing the management options for treatment, the patient elected to proceed with the above surgical procedure(s). We have discussed the potential benefits and risks of the procedure, side effects of the proposed treatment, the likelihood of the patient achieving the goals of the procedure, and any potential problems that might occur during the procedure or recuperation. Informed consent has been obtained.  Description of procedure:  The patient was taken to the operating room and general anesthesia was induced.  The patient was placed in the dorsal lithotomy position, prepped and draped in the usual sterile fashion, and preoperative antibiotics were administered. A preoperative time-out was performed.   Cystourethroscopy was performed. The bladder was  systematically examined in its entirety. There was no evidence for any bladder tumors, stones, or other mucosal pathology.    Attention then turned to the right  ureteral orifice and a ureteral catheter was used to intubate the ureteral orifice.  Omnipaque contrast was injected through the ureteral catheter and a retrograde pyelogram was performed with findings as dictated above.  A  0.38 sensor guidewire was then advanced up the right  ureter into the renal pelvis under fluoroscopic guidance. The 6 Fr semirigid ureteroscope was then advanced into the ureter next to the guidewire and the calculus was identified.   The stone was then fragmented with the 365 micron holmium laser fiber on a setting of 0.8J and frequency of 8 Hz.   All stones were then removed from the ureter with a zero tip nitinol basket.  Reinspection of the ureter revealed no remaining visible stones or fragments.   The wire was then backloaded through the cystoscope and a ureteral stent was advance over the wire using Seldinger technique.  The stent was positioned appropriately under fluoroscopic and cystoscopic guidance.  The wire was then removed with an adequate stent curl noted in the renal pelvis as well as in the bladder.  The bladder was then emptied and the procedure ended.  The patient appeared to tolerate the procedure well and without complications.  The patient was able to be awakened and transferred to the recovery unit in satisfactory condition.

## 2014-01-21 NOTE — Discharge Instructions (Addendum)
DISCHARGE INSTRUCTIONS FOR KIDNEY STONES OR URETERAL STENT  MEDICATIONS:   1. DO NOT RESUME YOUR ASPIRIN, or any other medicines like ibuprofen, motrin, excedrin, advil, aleve, vitamin E, fish oil as these can all cause bleeding x 7 days.  2. Resume all your other meds from home - except do not take any other pain meds that you may have at home.  ACTIVITY 1. No strenuous activity x 1week 2. No driving while on narcotic pain medications 3. Drink plenty of water 4. Continue to walk at home - you can still get blood clots when you are at home, so keep active, but don't over do it. 5. May return to work in 3 days.  BATHING 1. You can shower and we recommend daily showers  2. If you have a string coming from your urethra:  The stent string is attached to your ureteral stent.  Do not pull on this.   SIGNS/SYMPTOMS TO CALL: 1. Please call us if you have a fever greater than 101.5, uncontrolled  nausea/vomiting, uncontrolled pain, dizziness, unable to urinate, bloody urine, chest pain, shortness of breath, leg swelling, leg pain, redness around wound, drainage from wound, or any other concerns or questions.  You can reach us at [phone].  FOLLOW-UP 1. We will call with follow up. 2. You may remove the stent on Saturday  By pulling on the string    Post Anesthesia Home Care Instructions  Activity: Get plenty of rest for the remainder of the day. A responsible adult should stay with you for 24 hours following the procedure.  For the next 24 hours, DO NOT: -Drive a car -Advertising copywriterperate machinery -Drink alcoholic beverages -Take any medication unless instructed by your physician -Make any legal decisions or sign important papers.  Meals: Start with liquid foods such as gelatin or soup. Progress to regular foods as tolerated. Avoid greasy, spicy, heavy foods. If nausea and/or vomiting occur, drink only clear liquids until the nausea and/or vomiting subsides. Call your physician if vomiting  continues.  Special Instructions/Symptoms: Your throat may feel dry or sore from the anesthesia or the breathing tube placed in your throat during surgery. If this causes discomfort, gargle with warm salt water. The discomfort should disappear within 24 hours.

## 2014-01-21 NOTE — Interval H&P Note (Signed)
History and Physical Interval Note:  01/21/2014 1:04 PM  Cassandra PartridgeQuiana N Mcdaniel  has presented today for surgery, with the diagnosis of RIGHT URETERAL CALCULUS  The various methods of treatment have been discussed with the patient and family. After consideration of risks, benefits and other options for treatment, the patient has consented to  Procedure(s): CYSTOSCOPY WITH RETROGRADE PYELOGRAM, URETEROSCOPY AND POSSIBLE STENT PLACEMENT (Right) HOLMIUM LASER APPLICATION (Right) as a surgical intervention .  The patient's history has been reviewed, patient examined, no change in status, stable for surgery.  I have reviewed the patient's chart and labs.  Questions were answered to the patient's satisfaction.     Ally Knodel S

## 2014-01-22 ENCOUNTER — Encounter (HOSPITAL_BASED_OUTPATIENT_CLINIC_OR_DEPARTMENT_OTHER): Payer: Self-pay | Admitting: Urology

## 2018-01-21 ENCOUNTER — Encounter (HOSPITAL_COMMUNITY): Payer: Self-pay | Admitting: Emergency Medicine

## 2018-01-21 ENCOUNTER — Other Ambulatory Visit: Payer: Self-pay

## 2018-01-21 ENCOUNTER — Ambulatory Visit (HOSPITAL_COMMUNITY)
Admission: EM | Admit: 2018-01-21 | Discharge: 2018-01-21 | Disposition: A | Discharge status: Home / Self Care | Payer: 59 | Attending: Internal Medicine | Admitting: Internal Medicine

## 2018-01-21 DIAGNOSIS — Z87442 Personal history of urinary calculi: Secondary | ICD-10-CM

## 2018-01-21 DIAGNOSIS — N23 Unspecified renal colic: Secondary | ICD-10-CM

## 2018-01-21 LAB — POCT I-STAT, CHEM 8
BUN: 10 mg/dL (ref 6–20)
Calcium, Ion: 1.21 mmol/L (ref 1.15–1.40)
Chloride: 102 mmol/L (ref 101–111)
Creatinine, Ser: 0.9 mg/dL (ref 0.44–1.00)
GLUCOSE: 93 mg/dL (ref 65–99)
HCT: 39 % (ref 36.0–46.0)
HEMOGLOBIN: 13.3 g/dL (ref 12.0–15.0)
POTASSIUM: 4 mmol/L (ref 3.5–5.1)
Sodium: 140 mmol/L (ref 135–145)
TCO2: 25 mmol/L (ref 22–32)

## 2018-01-21 LAB — POCT URINALYSIS DIP (DEVICE)
Bilirubin Urine: NEGATIVE
Glucose, UA: NEGATIVE mg/dL
Ketones, ur: NEGATIVE mg/dL
Leukocytes, UA: NEGATIVE
NITRITE: NEGATIVE
PROTEIN: NEGATIVE mg/dL
Specific Gravity, Urine: <=1.005 (ref 1.005–1.030)
Urobilinogen, UA: 0.2 mg/dL (ref 0.0–1.0)
pH: 6 (ref 5.0–8.0)

## 2018-01-21 LAB — POCT PREGNANCY, URINE: PREG TEST UR: NEGATIVE

## 2018-01-21 MED ORDER — HYDROCODONE-ACETAMINOPHEN 5-325 MG PO TABS: 2.0000 | ORAL_TABLET | ORAL | 0 refills | Status: DC | PRN

## 2018-01-21 MED ORDER — KETOROLAC TROMETHAMINE 60 MG/2ML IM SOLN
INTRAMUSCULAR | Status: AC
  Filled 2018-01-21: qty 2

## 2018-01-21 MED ORDER — IBUPROFEN 800 MG PO TABS: 800.0000 mg | ORAL_TABLET | Freq: Three times a day (TID) | ORAL | 0 refills | Status: DC

## 2018-01-21 MED ORDER — TAMSULOSIN HCL 0.4 MG PO CAPS: 0.4000 mg | ORAL_CAPSULE | Freq: Every day | ORAL | 0 refills | Status: DC

## 2018-01-21 MED ORDER — ONDANSETRON HCL 4 MG/2ML IJ SOLN
4.0000 mg | Freq: Once | INTRAMUSCULAR | Status: AC
  Administered 2018-01-21: 4 mg via INTRAMUSCULAR

## 2018-01-21 MED ORDER — ONDANSETRON HCL 4 MG/2ML IJ SOLN
INTRAMUSCULAR | Status: AC
  Filled 2018-01-21: qty 2

## 2018-01-21 MED ORDER — ONDANSETRON HCL 4 MG PO TABS: 4.0000 mg | ORAL_TABLET | Freq: Three times a day (TID) | ORAL | 0 refills | Status: AC | PRN

## 2018-01-21 MED ORDER — KETOROLAC TROMETHAMINE 60 MG/2ML IM SOLN
60.0000 mg | Freq: Once | INTRAMUSCULAR | Status: AC
  Administered 2018-01-21: 60 mg via INTRAMUSCULAR

## 2018-01-21 NOTE — ED Triage Notes (Signed)
intermittent pain for a week.  Patient reports having had left flank pain, but now pain is in right abdomen.  Patient is having difficulty urinating.  Has spoke to "[facility]" but advised to come to ucc.  Patient has a history of kidney stones.

## 2018-01-21 NOTE — ED Provider Notes (Signed)
[facility]    CSN: [id number] Arrival date & time: 01/21/18  1614     History   Chief Complaint Chief Complaint  Patient presents with  . Abdominal Pain    HPI Cassandra Mcdaniel is a 42 y.o. female.   Cassandra Mcdaniel presents with complaints of pain after urinating which developed into left flank pain which comes and goes, as well as right pelvic pain which radiates into the groin. This started approximately 7 days ago and has progressed. Has a history of kidney stones, has required lithotripsy in the past, and states this feels just the same. Intermittent nausea. Pain improves after urination. Yesterday noted chills and sweats with pain. Has not taken any medications for pain today. Yesterday took ibuprofen. Without fevers.   ROS per HPI.       Past Medical History:  Diagnosis Date  . Chronic constipation   . Depression   . H/O cardiac murmur    PT DENIES S & S   . History of kidney stones   . Migraine   . Mild acid reflux    DIET CONTROLLED  . Right ureteral calculus   . Urgency of urination   . Vitamin D deficiency   . Wears contact lenses     Patient Active Problem List   Diagnosis Date Noted  . Ureteral calculi 01/21/2014    Past Surgical History:  Procedure Laterality Date  . CYSTOSCOPY WITH RETROGRADE PYELOGRAM, URETEROSCOPY AND STENT PLACEMENT Right 01/21/2014   Procedure: CYSTOSCOPY WITH RETROGRADE PYELOGRAM, URETEROSCOPY AND  STENT PLACEMENT;  Surgeon: Valetta Fulleravid S Grapey, MD;  [facility] [place];  Service: Urology;  Laterality: Right;  . HOLMIUM LASER APPLICATION Right 01/21/2014   Procedure: HOLMIUM LASER APPLICATION;  Surgeon: Valetta Fulleravid S Grapey, MD;  [facility] [place];  Service: Urology;  Laterality: Right;    OB History    No data available       Home Medications    Prior to Admission medications   Medication Sig Start Date End Date Taking? Authorizing Provider  levETIRAcetam (KEPPRA) 500 MG tablet Take 500  mg by mouth 2 (two) times daily.   Yes [provider]  Cholecalciferol (VITAMIN D3) 50000 UNITS CAPS Take 1 capsule by mouth once a week.    [provider]  folic acid (FOLVITE) 1 MG tablet Take 1 mg by mouth daily.    [provider]  HYDROcodone-acetaminophen (NORCO/VICODIN) 5-325 MG tablet Take 2 tablets by mouth every 4 (four) hours as needed. 01/21/18   Georgetta HaberBurky, Natalie B, NP  ibuprofen (ADVIL,MOTRIN) 800 MG tablet Take 1 tablet (800 mg total) by mouth 3 (three) times daily. 01/21/18   Georgetta HaberBurky, Natalie B, NP  Linaclotide (LINZESS) 290 MCG CAPS capsule Take 290 mcg by mouth every evening.    [provider]  promethazine (PHENERGAN) 25 MG tablet Take 25 mg by mouth every 6 (six) hours as needed for nausea or vomiting.    [provider]  tamsulosin (FLOMAX) 0.4 MG CAPS capsule Take 1 capsule (0.4 mg total) by mouth daily. 01/21/18   Georgetta HaberBurky, Natalie B, NP  tiZANidine (ZANAFLEX) 4 MG capsule Take 4 mg by mouth 3 (three) times daily as needed for muscle spasms.    [provider]    Family History Family History  Problem Relation Age of Onset  . Hyperlipidemia Mother   . Hypertension Mother   . Diabetes Mother   . Heart Problems Father   . Hyperlipidemia Father   .  Hypertension Father     Social History Social History   Tobacco Use  . Smoking status: Never Smoker  . Smokeless tobacco: Never Used  Substance Use Topics  . Alcohol use: Yes    Comment: Ocassionally   . Drug use: No     Allergies   Imitrex [sumatriptan]   Review of Systems Review of Systems   Physical Exam Triage Vital Signs ED Triage Vitals  Enc Vitals Group     BP 01/21/18 1753 (!) 145/87     Pulse Rate 01/21/18 1753 82     Resp 01/21/18 1753 (!) 22     Temp 01/21/18 1753 97.8 F (36.6 C)     Temp Source 01/21/18 1753 Oral     SpO2 01/21/18 1753 100 %     Weight --      Height --      Head Circumference --      Peak Flow --      Pain Score  01/21/18 1749 10     Pain Loc --      Pain Edu? --      Excl. in GC? --    No data found.  Updated Vital Signs BP (!) 145/87 (BP Location: Left Arm)   Pulse 82   Temp 97.8 F (36.6 C) (Oral)   Resp (!) 22   LMP 01/16/2018   SpO2 100%   Visual Acuity Right Eye Distance:   Left Eye Distance:   Bilateral Distance:    Right Eye Near:   Left Eye Near:    Bilateral Near:     Physical Exam  Constitutional: She is oriented to person, place, and time. She appears well-developed and well-nourished. No distress.  Very obviously uncomfortable  Cardiovascular: Normal rate, regular rhythm and normal heart sounds.  Pulmonary/Chest: Effort normal and breath sounds normal.  Abdominal: Soft. Bowel sounds are normal. There is tenderness in the right lower quadrant and suprapubic area. There is no rigidity, no rebound, no guarding, no CVA tenderness, no tenderness at McBurney's point and negative Murphy's sign.  Currently without CVA tenderness  Neurological: She is alert and oriented to person, place, and time.  Skin: Skin is warm and dry.     UC Treatments / Results  Labs (all labs ordered are listed, but only abnormal results are displayed) Labs Reviewed  POCT URINALYSIS DIP (DEVICE) - Abnormal; Notable for the following components:      Result Value   Hgb urine dipstick LARGE (*)    All other components within normal limits  POCT PREGNANCY, URINE  POCT I-STAT, CHEM 8    EKG  EKG Interpretation None       Radiology No results found.  Procedures Procedures (including critical care time)  Medications Ordered in UC Medications  ketorolac (TORADOL) injection 60 mg (60 mg Intramuscular Given 01/21/18 1841)  ondansetron (ZOFRAN) injection 4 mg (4 mg Intramuscular Given 01/21/18 1841)     Initial Impression / Assessment and Plan / UC Course  I have reviewed the triage vital signs and the nursing notes.  Pertinent labs & imaging results that were available during my care  of the patient were reviewed by me and considered in my medical decision making (see chart for details).     Large HGB to urine, symptoms appear consistent with kidney stone. Concern that has not passed in the past 7 days and may need intervention. Without nitrite, leuks, fevers, or tachycardia. Patient does not wish to go to the ER for  CT scan, stating she just would like to call her urologist tomorrow directly. Flomax, ibuprofen, hydrocodone, zofran tonight until can call urologist tomorrow for further treatment. Patient verbalized understanding and agreeable to plan.  Return precautions provided.   Final Clinical Impressions(s) / UC Diagnoses   Final diagnoses:  Bilateral renal colic  Personal history of kidney stones    ED Discharge Orders        Ordered    tamsulosin (FLOMAX) 0.4 MG CAPS capsule  Daily     01/21/18 1844    ibuprofen (ADVIL,MOTRIN) 800 MG tablet  3 times daily     01/21/18 1844    HYDROcodone-acetaminophen (NORCO/VICODIN) 5-325 MG tablet  Every 4 hours PRN     01/21/18 1844       Controlled Substance Prescriptions [place] Controlled Substance Registry consulted? No   Linus Mako B, NP 01/21/18 1851

## 2018-01-21 NOTE — Discharge Instructions (Signed)
Please call your urologist tomorrow morning for further evaluation and treatment. May start daily flomax. Ibuprofen every 8 hours.  Hydrocodone every 6 hours as needed for pain. May cause drowsiness. If develop fevers, chills, unable to urinate, worsening pain or otherwise worsening please go to ER for CT scan.

## 2018-03-24 ENCOUNTER — Emergency Department (HOSPITAL_COMMUNITY): Payer: 59

## 2018-03-24 ENCOUNTER — Emergency Department (HOSPITAL_COMMUNITY)
Admission: EM | Admit: 2018-03-24 | Discharge: 2018-03-24 | Disposition: A | Discharge status: Home / Self Care | Payer: 59 | Attending: Emergency Medicine | Admitting: Emergency Medicine

## 2018-03-24 ENCOUNTER — Encounter (HOSPITAL_COMMUNITY): Payer: Self-pay | Admitting: Emergency Medicine

## 2018-03-24 DIAGNOSIS — F329 Major depressive disorder, single episode, unspecified: Secondary | ICD-10-CM | POA: Insufficient documentation

## 2018-03-24 DIAGNOSIS — R1031 Right lower quadrant pain: Secondary | ICD-10-CM | POA: Diagnosis present

## 2018-03-24 DIAGNOSIS — Z79899 Other long term (current) drug therapy: Secondary | ICD-10-CM | POA: Diagnosis not present

## 2018-03-24 DIAGNOSIS — N200 Calculus of kidney: Secondary | ICD-10-CM | POA: Diagnosis not present

## 2018-03-24 LAB — URINALYSIS, ROUTINE W REFLEX MICROSCOPIC
BILIRUBIN URINE: NEGATIVE
Glucose, UA: NEGATIVE mg/dL
KETONES UR: NEGATIVE mg/dL
LEUKOCYTES UA: NEGATIVE
NITRITE: NEGATIVE
Protein, ur: NEGATIVE mg/dL
SPECIFIC GRAVITY, URINE: 1.018 (ref 1.005–1.030)
pH: 6 (ref 5.0–8.0)

## 2018-03-24 LAB — COMPREHENSIVE METABOLIC PANEL
ALT: 16 U/L (ref 14–54)
AST: 25 U/L (ref 15–41)
Albumin: 4.5 g/dL (ref 3.5–5.0)
Alkaline Phosphatase: 45 U/L (ref 38–126)
Anion gap: 10 (ref 5–15)
BUN: 14 mg/dL (ref 6–20)
CO2: 26 mmol/L (ref 22–32)
Calcium: 9.2 mg/dL (ref 8.9–10.3)
Chloride: 107 mmol/L (ref 101–111)
Creatinine, Ser: 1.01 mg/dL — ABNORMAL HIGH (ref 0.44–1.00)
Glucose, Bld: 102 mg/dL — ABNORMAL HIGH (ref 65–99)
Potassium: 3.6 mmol/L (ref 3.5–5.1)
Sodium: 143 mmol/L (ref 135–145)
TOTAL PROTEIN: 8.4 g/dL — AB (ref 6.5–8.1)
Total Bilirubin: 0.5 mg/dL (ref 0.3–1.2)

## 2018-03-24 LAB — CBC
HCT: 35.4 % — ABNORMAL LOW (ref 36.0–46.0)
Hemoglobin: 10.9 g/dL — ABNORMAL LOW (ref 12.0–15.0)
MCH: 22.9 pg — ABNORMAL LOW (ref 26.0–34.0)
MCHC: 30.8 g/dL (ref 30.0–36.0)
MCV: 74.5 fL — ABNORMAL LOW (ref 78.0–100.0)
Platelets: 389 10*3/uL (ref 150–400)
RBC: 4.75 MIL/uL (ref 3.87–5.11)
RDW: 15.1 % (ref 11.5–15.5)
WBC: 4.3 10*3/uL (ref 4.0–10.5)

## 2018-03-24 LAB — I-STAT BETA HCG BLOOD, ED (MC, WL, AP ONLY): I-stat hCG, quantitative: <5 m[IU]/mL (ref <5)

## 2018-03-24 LAB — LIPASE, BLOOD: Lipase: 48 U/L (ref 11–51)

## 2018-03-24 MED ORDER — PROMETHAZINE HCL 25 MG/ML IJ SOLN
25.0000 mg | Freq: Once | INTRAMUSCULAR | Status: AC
  Administered 2018-03-24: 25 mg via INTRAVENOUS
  Filled 2018-03-24: qty 1

## 2018-03-24 MED ORDER — HYDROMORPHONE HCL 1 MG/ML IJ SOLN
1.0000 mg | Freq: Once | INTRAMUSCULAR | Status: AC
  Administered 2018-03-24: 1 mg via INTRAVENOUS
  Filled 2018-03-24: qty 1

## 2018-03-24 MED ORDER — KETOROLAC TROMETHAMINE 30 MG/ML IJ SOLN
30.0000 mg | Freq: Once | INTRAMUSCULAR | Status: AC
  Administered 2018-03-24: 30 mg via INTRAVENOUS
  Filled 2018-03-24: qty 1

## 2018-03-24 MED ORDER — SODIUM CHLORIDE 0.9 % IV BOLUS
1000.0000 mL | Freq: Once | INTRAVENOUS | Status: AC
  Administered 2018-03-24: 1000 mL via INTRAVENOUS

## 2018-03-24 MED ORDER — ONDANSETRON HCL 4 MG/2ML IJ SOLN
4.0000 mg | Freq: Once | INTRAMUSCULAR | Status: AC
  Administered 2018-03-24: 4 mg via INTRAVENOUS
  Filled 2018-03-24: qty 2

## 2018-03-24 MED ORDER — OXYCODONE-ACETAMINOPHEN 5-325 MG PO TABS: 1.0000 | ORAL_TABLET | ORAL | 0 refills | Status: DC | PRN

## 2018-03-24 MED ORDER — TAMSULOSIN HCL 0.4 MG PO CAPS: 0.4000 mg | ORAL_CAPSULE | Freq: Two times a day (BID) | ORAL | 0 refills | Status: AC

## 2018-03-24 MED ORDER — PROMETHAZINE HCL 25 MG PO TABS: 25.0000 mg | ORAL_TABLET | Freq: Four times a day (QID) | ORAL | 0 refills | Status: DC | PRN

## 2018-03-24 MED ORDER — ONDANSETRON 4 MG PO TBDP
4.0000 mg | ORAL_TABLET | Freq: Once | ORAL | Status: AC | PRN
  Administered 2018-03-24: 4 mg via ORAL
  Filled 2018-03-24: qty 1

## 2018-03-24 NOTE — ED Notes (Signed)
Pt aware urine sample is needed, states that cath maybe needed.

## 2018-03-24 NOTE — ED Provider Notes (Signed)
[facility]EMERGENCY DEPT Provider Note   CSN: [id number] Arrival date & time: 03/24/18  1315     History   Chief Complaint Chief Complaint  Patient presents with  . Abdominal Pain  . Nausea    HPI Cassandra Mcdaniel is a 42 y.o. female who presents with cc of RLQ abdominal pain.  She has a past medical history of recurrence kidney stones and states that this feels the same.  She has pain radiating into her right groin and labia.  She has urinary urgency and frequency without production of much urine.  She denies fevers or chills.  She was seen at an urgent care earlier today and sent to the emergency department.  She has had previous obstructing stones requiring lithotripsy.  Followed by Dr. Isabel Caprice  HPI  Past Medical History:  Diagnosis Date  . Chronic constipation   . Depression   . H/O cardiac murmur    PT DENIES S & S   . History of kidney stones   . Migraine   . Mild acid reflux    DIET CONTROLLED  . Right ureteral calculus   . Urgency of urination   . Vitamin D deficiency   . Wears contact lenses     Patient Active Problem List   Diagnosis Date Noted  . Ureteral calculi 01/21/2014    Past Surgical History:  Procedure Laterality Date  . CYSTOSCOPY WITH RETROGRADE PYELOGRAM, URETEROSCOPY AND STENT PLACEMENT Right 01/21/2014   Procedure: CYSTOSCOPY WITH RETROGRADE PYELOGRAM, URETEROSCOPY AND  STENT PLACEMENT;  Surgeon: Valetta Fuller, MD;  [facility];  Service: Urology;  Laterality: Right;  . HOLMIUM LASER APPLICATION Right 01/21/2014   Procedure: HOLMIUM LASER APPLICATION;  Surgeon: Valetta Fuller, MD;  [facility];  Service: Urology;  Laterality: Right;     OB History   None      Home Medications    Prior to Admission medications   Medication Sig Start Date End Date Taking? Authorizing Provider  Cholecalciferol (VITAMIN D3) 50000 UNITS CAPS Take 1 capsule by mouth once a week.     [provider]  folic acid (FOLVITE) 1 MG tablet Take 1 mg by mouth daily.    [provider]  HYDROcodone-acetaminophen (NORCO/VICODIN) 5-325 MG tablet Take 2 tablets by mouth every 4 (four) hours as needed. 01/21/18   Georgetta Haber, NP  ibuprofen (ADVIL,MOTRIN) 800 MG tablet Take 1 tablet (800 mg total) by mouth 3 (three) times daily. 01/21/18   Georgetta Haber, NP  levETIRAcetam (KEPPRA) 500 MG tablet Take 500 mg by mouth 2 (two) times daily.    [provider]  Linaclotide (LINZESS) 290 MCG CAPS capsule Take 290 mcg by mouth every evening.    [provider]  ondansetron (ZOFRAN) 4 MG tablet Take 1 tablet (4 mg total) by mouth every 8 (eight) hours as needed for nausea or vomiting. 01/21/18   Georgetta Haber, NP  promethazine (PHENERGAN) 25 MG tablet Take 25 mg by mouth every 6 (six) hours as needed for nausea or vomiting.    [provider]  tamsulosin (FLOMAX) 0.4 MG CAPS capsule Take 1 capsule (0.4 mg total) by mouth daily. 01/21/18   Georgetta Haber, NP  tiZANidine (ZANAFLEX) 4 MG capsule Take 4 mg by mouth 3 (three) times daily as needed for muscle spasms.    [provider]    Family History Family History  Problem Relation Age of Onset  .  Hyperlipidemia Mother   . Hypertension Mother   . Diabetes Mother   . Heart Problems Father   . Hyperlipidemia Father   . Hypertension Father     Social History Social History   Tobacco Use  . Smoking status: Never Smoker  . Smokeless tobacco: Never Used  Substance Use Topics  . Alcohol use: Yes    Comment: Ocassionally   . Drug use: No     Allergies   Imitrex [sumatriptan]   Review of Systems Review of Systems Ten systems reviewed and are negative for acute change, except as noted in the HPI.    Physical Exam Updated Vital Signs BP (!) 154/100 (BP Location: Left Arm)   Pulse 80   Temp 98.3 F (36.8 C) (Oral)   Resp 17   Ht 5\' 2"  (1.575 m)   Wt 63.5 kg (140 lb)    LMP 03/14/2018   SpO2 95%   BMI 25.61 kg/m   Physical Exam  Constitutional: She is oriented to person, place, and time. She appears well-developed and well-nourished. No distress.  Appears uncomfortable  HENT:  Head: Normocephalic and atraumatic.  Eyes: Conjunctivae are normal. No scleral icterus.  Neck: Normal range of motion.  Cardiovascular: Normal rate, regular rhythm and normal heart sounds. Exam reveals no gallop and no friction rub.  No murmur heard. Pulmonary/Chest: Effort normal and breath sounds normal. No respiratory distress.  Abdominal: Soft. Bowel sounds are normal. She exhibits no distension and no mass. There is tenderness in the right lower quadrant. There is guarding. There is no rebound.  Neurological: She is alert and oriented to person, place, and time.  Skin: Skin is warm and dry. She is not diaphoretic.  Psychiatric: Her behavior is normal.  Nursing note and vitals reviewed.    ED Treatments / Results  Labs (all labs ordered are listed, but only abnormal results are displayed) Labs Reviewed  COMPREHENSIVE METABOLIC PANEL - Abnormal; Notable for the following components:      Result Value   Glucose, Bld 102 (*)    Creatinine, Ser 1.01 (*)    Total Protein 8.4 (*)    All other components within normal limits  CBC - Abnormal; Notable for the following components:   Hemoglobin 10.9 (*)    HCT 35.4 (*)    MCV 74.5 (*)    MCH 22.9 (*)    All other components within normal limits  LIPASE, BLOOD  URINALYSIS, ROUTINE W REFLEX MICROSCOPIC  I-STAT BETA HCG BLOOD, ED ([facility], [facility], [facility] ONLY)    EKG None  Radiology No results found.  Procedures Procedures (including critical care time)  Medications Ordered in ED Medications  ondansetron (ZOFRAN-ODT) disintegrating tablet 4 mg (4 mg Oral Given 03/24/18 1348)     Initial Impression / Assessment and Plan / ED Course  I have reviewed the triage vital signs and the nursing notes.  Pertinent labs & imaging  results that were available during my care of the patient were reviewed by me and considered in my medical decision making (see chart for details).  Clinical Course as of Mar 25 2327  Wynelle Link Mar 24, 2018  2028 Patient pain is resolved, but she continues to have severe nausea . Will treat with fluids and phenergan.   [AH]    Clinical Course User Index [AH] Arthor Captain, PA-C    Patient appears to have recently passed a stone into her bladder.  Her pain is resolved.  She had associated migraine headache which is also  resolving.  No nausea or vomiting at this time.  I reviewed her lab work which is unremarkable.  Patient will be discharged with pain medication, antiemetics and Flomax.  She can follow-up with her PCP or urology.  I discussed return precautions.  Final Clinical Impressions(s) / ED Diagnoses   Final diagnoses:  Kidney stone    ED Discharge Orders    None       Arthor CaptainHarris, Zanden Colver, PA-C 03/24/18 2331    Lorre NickAllen, Anthony, MD 03/26/18 1020

## 2018-03-24 NOTE — ED Triage Notes (Signed)
Pt c/o RLQ pain that started today. [facility].  Reports nausea. Hx kidney stones.

## 2018-03-24 NOTE — Discharge Instructions (Addendum)
Return to the ED immediately if you develop fever, uncontrolled pain or vomiting, or other concerns. ° °

## 2018-03-24 NOTE — ED Notes (Signed)
Asked urine  

## 2019-02-21 ENCOUNTER — Emergency Department (HOSPITAL_COMMUNITY): Payer: 59

## 2019-02-21 ENCOUNTER — Encounter (HOSPITAL_COMMUNITY): Payer: Self-pay

## 2019-02-21 ENCOUNTER — Emergency Department (HOSPITAL_COMMUNITY)
Admission: EM | Admit: 2019-02-21 | Discharge: 2019-02-21 | Discharge status: Against Medical Advice | Payer: 59 | Attending: Emergency Medicine | Admitting: Emergency Medicine

## 2019-02-21 ENCOUNTER — Other Ambulatory Visit: Payer: Self-pay

## 2019-02-21 DIAGNOSIS — R0781 Pleurodynia: Secondary | ICD-10-CM | POA: Insufficient documentation

## 2019-02-21 DIAGNOSIS — R05 Cough: Secondary | ICD-10-CM | POA: Diagnosis not present

## 2019-02-21 DIAGNOSIS — R042 Hemoptysis: Secondary | ICD-10-CM | POA: Insufficient documentation

## 2019-02-21 DIAGNOSIS — Z79899 Other long term (current) drug therapy: Secondary | ICD-10-CM | POA: Diagnosis not present

## 2019-02-21 LAB — CBC
HEMATOCRIT: 33.7 % — AB (ref 36.0–46.0)
Hemoglobin: 9.7 g/dL — ABNORMAL LOW (ref 12.0–15.0)
MCH: 22 pg — ABNORMAL LOW (ref 26.0–34.0)
MCHC: 28.8 g/dL — ABNORMAL LOW (ref 30.0–36.0)
MCV: 76.4 fL — ABNORMAL LOW (ref 80.0–100.0)
NRBC: 0 % (ref 0.0–0.2)
PLATELETS: 380 10*3/uL (ref 150–400)
RBC: 4.41 MIL/uL (ref 3.87–5.11)
RDW: 15.2 % (ref 11.5–15.5)
WBC: 4.1 10*3/uL (ref 4.0–10.5)

## 2019-02-21 LAB — I-STAT TROPONIN, ED: Troponin i, poc: 0.01 ng/mL (ref 0.00–0.08)

## 2019-02-21 LAB — BASIC METABOLIC PANEL
ANION GAP: 8 (ref 5–15)
BUN: 12 mg/dL (ref 6–20)
CHLORIDE: 106 mmol/L (ref 98–111)
CO2: 24 mmol/L (ref 22–32)
Calcium: 9.8 mg/dL (ref 8.9–10.3)
Creatinine, Ser: 1.06 mg/dL — ABNORMAL HIGH (ref 0.44–1.00)
GFR calc non Af Amer: >60 mL/min (ref >60)
Glucose, Bld: 109 mg/dL — ABNORMAL HIGH (ref 70–99)
Potassium: 4.3 mmol/L (ref 3.5–5.1)
SODIUM: 138 mmol/L (ref 135–145)

## 2019-02-21 LAB — I-STAT BETA HCG BLOOD, ED (MC, WL, AP ONLY)

## 2019-02-21 MED ORDER — KETOROLAC TROMETHAMINE 30 MG/ML IJ SOLN
30.0000 mg | Freq: Once | INTRAMUSCULAR | Status: AC
  Administered 2019-02-21: 30 mg via INTRAVENOUS
  Filled 2019-02-21: qty 1

## 2019-02-21 MED ORDER — SODIUM CHLORIDE 0.9% FLUSH: 3.0000 mL | Freq: Once | INTRAVENOUS | Status: DC

## 2019-02-21 NOTE — ED Provider Notes (Signed)
[facility]ENC[facility] Provider Note   CSN: [id number] Arrival date & time: 02/21/19  1238    History   Chief Complaint Chief Complaint  Patient presents with  . URI  . Shortness of Breath    HPI Cassandra Mcdaniel is a 43 y.o. female.     Patient is a 43 year old female with history of migraines, depression, and renal calculi.  She presents today for evaluation of chest congestion and cough.  This is been ongoing for the past week.  Yesterday she developed pain to the right side of her chest that is worse with inspiration.  She also had several episodes of hemoptysis.  She also reports a recent car trip to [place].  She denies any leg pain or swelling.  The history is provided by the patient.  URI  Presenting symptoms: congestion and cough   Severity:  Moderate Timing:  Constant Progression:  Worsening Chronicity:  New Relieved by:  Nothing Worsened by:  Breathing Ineffective treatments:  None tried Shortness of Breath  Associated symptoms: cough     Past Medical History:  Diagnosis Date  . Chronic constipation   . Depression   . H/O cardiac murmur    PT DENIES S & S   . History of kidney stones   . Migraine   . Mild acid reflux    DIET CONTROLLED  . Right ureteral calculus   . Urgency of urination   . Vitamin D deficiency   . Wears contact lenses     Patient Active Problem List   Diagnosis Date Noted  . Ureteral calculi 01/21/2014    Past Surgical History:  Procedure Laterality Date  . CYSTOSCOPY WITH RETROGRADE PYELOGRAM, URETEROSCOPY AND STENT PLACEMENT Right 01/21/2014   Procedure: CYSTOSCOPY WITH RETROGRADE PYELOGRAM, URETEROSCOPY AND  STENT PLACEMENT;  Surgeon: Valetta Fulleravid S Grapey, MD;  [facility] [place];  Service: Urology;  Laterality: Right;  . HOLMIUM LASER APPLICATION Right 01/21/2014   Procedure: HOLMIUM LASER APPLICATION;  Surgeon: Valetta Fulleravid S Grapey, MD;  [facility] [place];  Service:  Urology;  Laterality: Right;     OB History   No obstetric history on file.      Home Medications    Prior to Admission medications   Medication Sig Start Date End Date Taking? Authorizing Provider  AIMOVIG 70 MG/ML SOAJ Inject the contents of 1 pen into the pen into the skin every 30 days 03/14/18   [provider]  atorvastatin (LIPITOR) 10 MG tablet Take 10 mg by mouth daily. 03/22/18   [provider]  Biotin w/ Vitamins C & E (HAIR/SKIN/NAILS PO) Take 1 tablet by mouth daily.    [provider]  Cholecalciferol (VITAMIN D3) 50000 UNITS CAPS Take 1 capsule by mouth once a week.    [provider]  folic acid (FOLVITE) 1 MG tablet Take 1 mg by mouth daily.    [provider]  HYDROcodone-acetaminophen (NORCO/VICODIN) 5-325 MG tablet Take 2 tablets by mouth every 4 (four) hours as needed. 01/21/18   Georgetta HaberBurky, Natalie B, NP  ibuprofen (ADVIL,MOTRIN) 800 MG tablet Take 1 tablet (800 mg total) by mouth 3 (three) times daily. 01/21/18   Georgetta HaberBurky, Natalie B, NP  ketorolac (TORADOL) 10 MG tablet Take 10 mg by mouth every 6 (six) hours as needed. 02/25/18   [provider]  Linaclotide (LINZESS) 290 MCG CAPS capsule Take 290 mcg by mouth every evening.    [provider]  ondansetron [facility](ZOFRAN)  4 MG tablet Take 1 tablet (4 mg total) by mouth every 8 (eight) hours as needed for nausea or vomiting. 01/21/18   Georgetta Haber, NP  oxyCODONE-acetaminophen (PERCOCET) 5-325 MG tablet Take 1-2 tablets by mouth every 4 (four) hours as needed. 03/24/18   Arthor Captain, PA-C  Probiotic Product (PROBIOTIC PO) Take 1 tablet by mouth daily.    [provider]  promethazine (PHENERGAN) 25 MG tablet Take 25 mg by mouth every 6 (six) hours as needed for nausea or vomiting.    [provider]  promethazine (PHENERGAN) 25 MG tablet Take 1 tablet (25 mg total) by mouth every 6 (six) hours as needed for nausea or vomiting. 03/24/18   Arthor Captain, PA-C  sertraline (ZOLOFT) 100 MG tablet Take 150 mg by mouth daily. 03/22/18   [provider]  tamsulosin (FLOMAX) 0.4 MG CAPS capsule Take 1 capsule (0.4 mg total) by mouth daily. Patient not taking: Reported on 03/24/2018 01/21/18   Linus Mako B, NP  tamsulosin (FLOMAX) 0.4 MG CAPS capsule Take 1 capsule (0.4 mg total) by mouth 2 (two) times daily. 03/24/18   Harris, Abigail, PA-C  temazepam (RESTORIL) 15 MG capsule Take 15 mg by mouth at bedtime as needed for sleep. 02/24/18   [provider]  tiZANidine (ZANAFLEX) 4 MG capsule Take 4 mg by mouth 3 (three) times daily as needed for muscle spasms.    [provider]    Family History Family History  Problem Relation Age of Onset  . Hyperlipidemia Mother   . Hypertension Mother   . Diabetes Mother   . Heart Problems Father   . Hyperlipidemia Father   . Hypertension Father     Social History Social History   Tobacco Use  . Smoking status: Never Smoker  . Smokeless tobacco: Never Used  Substance Use Topics  . Alcohol use: Yes    Comment: Ocassionally   . Drug use: No     Allergies   Imitrex [sumatriptan] and Topiramate   Review of Systems Review of Systems  HENT: Positive for congestion.   Respiratory: Positive for cough and shortness of breath.   All other systems reviewed and are negative.    Physical Exam Updated Vital Signs BP (!) 123/94 (BP Location: Right Arm)   Pulse 79   Temp 98 F (36.7 C) (Oral)   Resp 14   SpO2 99%   Physical Exam Vitals signs and nursing note reviewed.  Constitutional:      General: She is not in acute distress.    Appearance: She is well-developed. She is not diaphoretic.  HENT:     Head: Normocephalic and atraumatic.  Neck:     Musculoskeletal: Normal range of motion and neck supple.  Cardiovascular:     Rate and Rhythm: Normal rate and regular rhythm.     Heart sounds: No murmur. No friction rub. No gallop.   Pulmonary:     Effort:  Pulmonary effort is normal. No respiratory distress.     Breath sounds: Normal breath sounds. No wheezing.  Abdominal:     General: Bowel sounds are normal. There is no distension.     Palpations: Abdomen is soft.     Tenderness: There is no abdominal tenderness.  Musculoskeletal: Normal range of motion.     Right lower leg: She exhibits no tenderness. No edema.     Left lower leg: She exhibits no tenderness. No edema.     Comments: Homans sign is absent bilaterally.  Skin:    General: Skin is warm and dry.  Neurological:     Mental Status: She is alert and oriented to person, place, and time.      ED Treatments / Results  Labs (all labs ordered are listed, but only abnormal results are displayed) Labs Reviewed  BASIC METABOLIC PANEL - Abnormal; Notable for the following components:      Result Value   Glucose, Bld 109 (*)    Creatinine, Ser 1.06 (*)    All other components within normal limits  CBC - Abnormal; Notable for the following components:   Hemoglobin 9.7 (*)    HCT 33.7 (*)    MCV 76.4 (*)    MCH 22.0 (*)    MCHC 28.8 (*)    All other components within normal limits  I-STAT TROPONIN, ED  I-STAT BETA HCG BLOOD, ED ([facility], [facility], [facility] ONLY)    EKG None  Radiology Dg Chest 2 View  Result Date: 02/21/2019 CLINICAL DATA:  Chest pain. EXAM: CHEST - 2 VIEW COMPARISON:  None FINDINGS: The heart size and mediastinal contours are within normal limits. Both lungs are clear. The visualized skeletal structures are unremarkable. IMPRESSION: No active cardiopulmonary disease. Electronically Signed   By: Signa Kell M.D.   On: 02/21/2019 13:13    Procedures Procedures (including critical care time)  Medications Ordered in ED Medications  sodium chloride flush (NS) 0.9 % injection 3 mL (has no administration in time range)     Initial Impression / Assessment and Plan / ED Course  I have reviewed the triage vital signs and the nursing notes.  Pertinent labs & imaging  results that were available during my care of the patient were reviewed by me and considered in my medical decision making (see chart for details).  Patient presenting with complaints of pain to the right side of her chest that is worse with inspiration.  Her symptoms sound like pleurisy, however with a recent car trip, I feel as though PE needs to be ruled out.  Patient was to undergo a CT scan of her chest to rule out this possibility, however she ended up leaving AGAINST MEDICAL ADVICE before I had a chance to speak with her.  Apparently the wait for the CT scan was too long and she had other obligations she had to tend to.  Final Clinical Impressions(s) / ED Diagnoses   Final diagnoses:  None    ED Discharge Orders    None       Geoffery Lyons, MD 02/26/19 2342

## 2019-02-21 NOTE — ED Notes (Signed)
bp 109/72 not  This pts

## 2019-02-21 NOTE — ED Triage Notes (Signed)
Pt reports right side chest pain, fatigue, pain with inspiration primarily on the right side. Patient did recently travel to [place] by car.

## 2020-07-16 IMAGING — DX CHEST - 2 VIEW
2 series · 2 of 2 positions shown · non-contrast
Comparison: None

CLINICAL DATA: Chest pain.

EXAM:
CHEST - 2 VIEW

[chest pa]
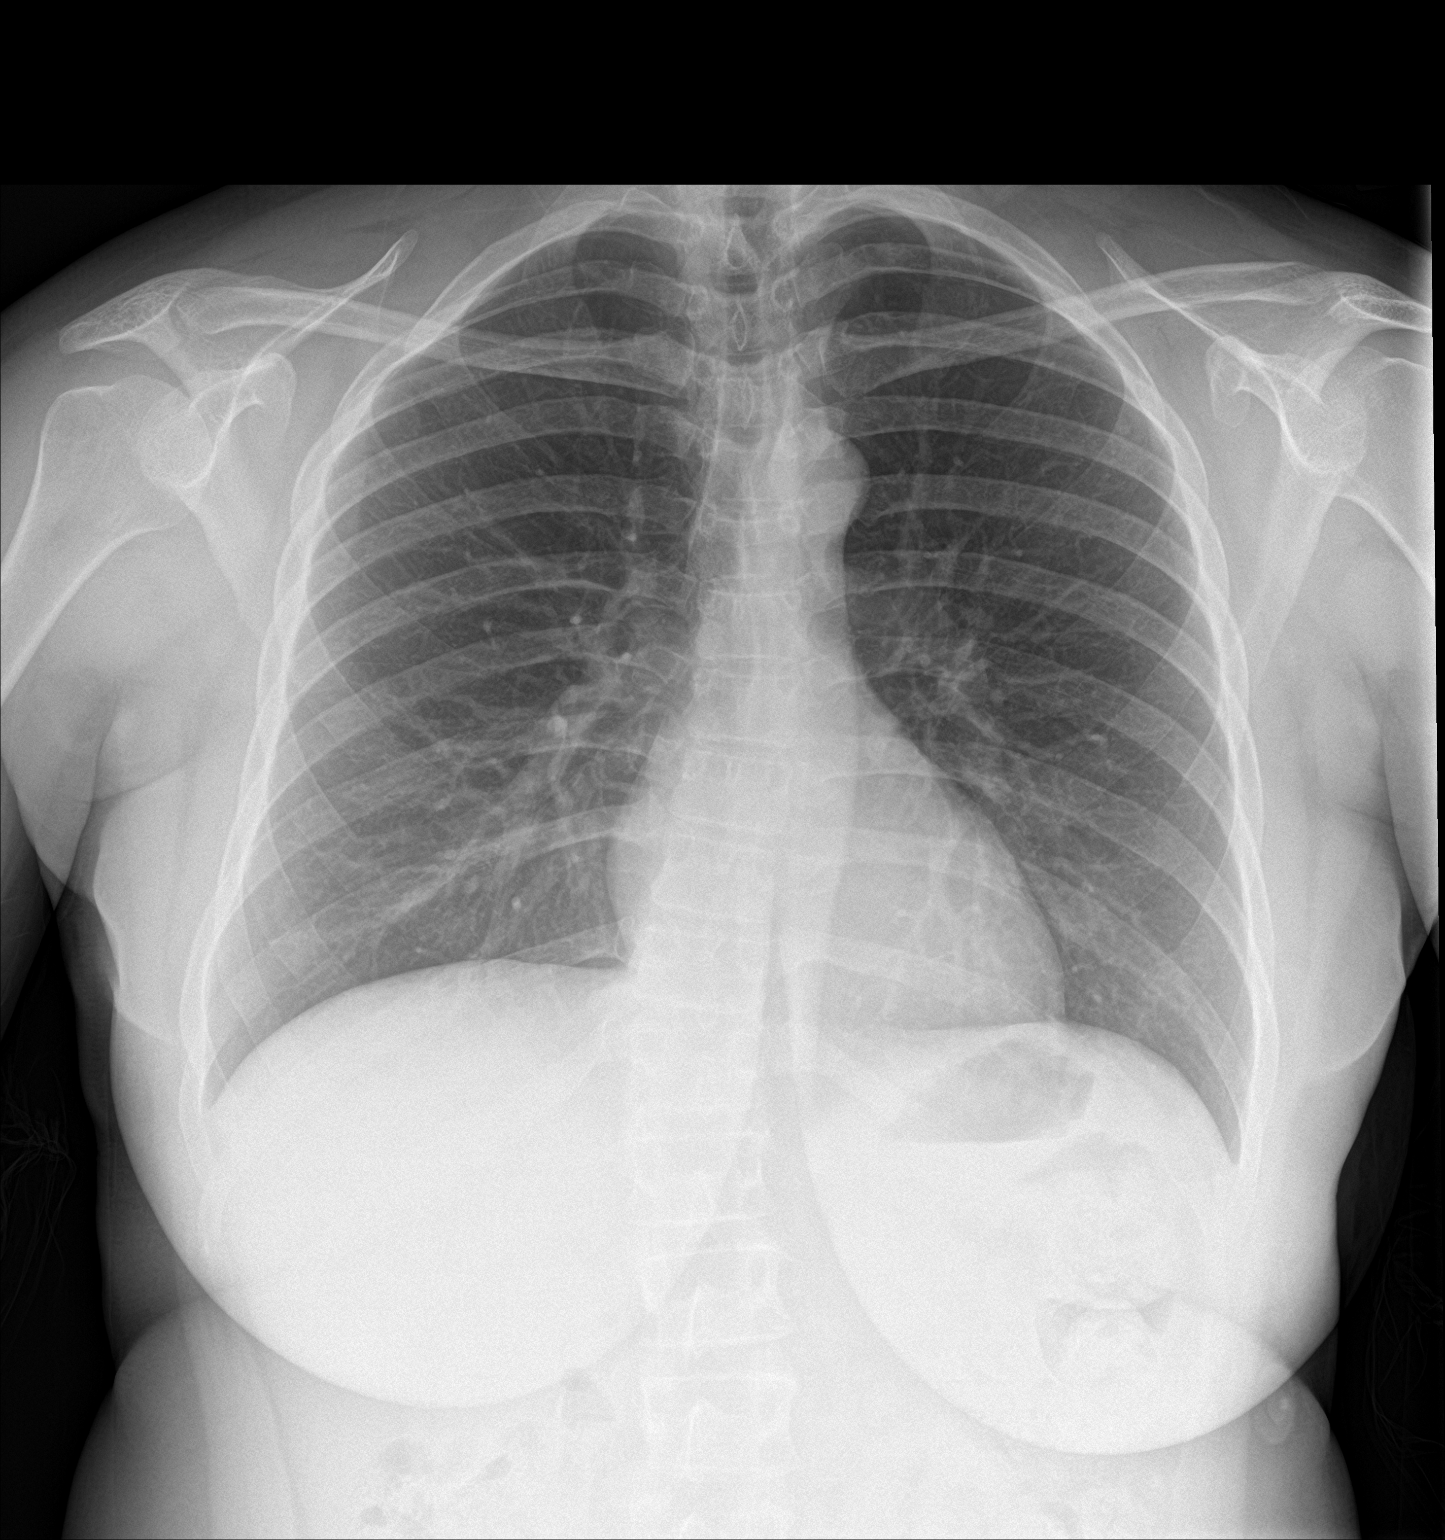

[chest lat]
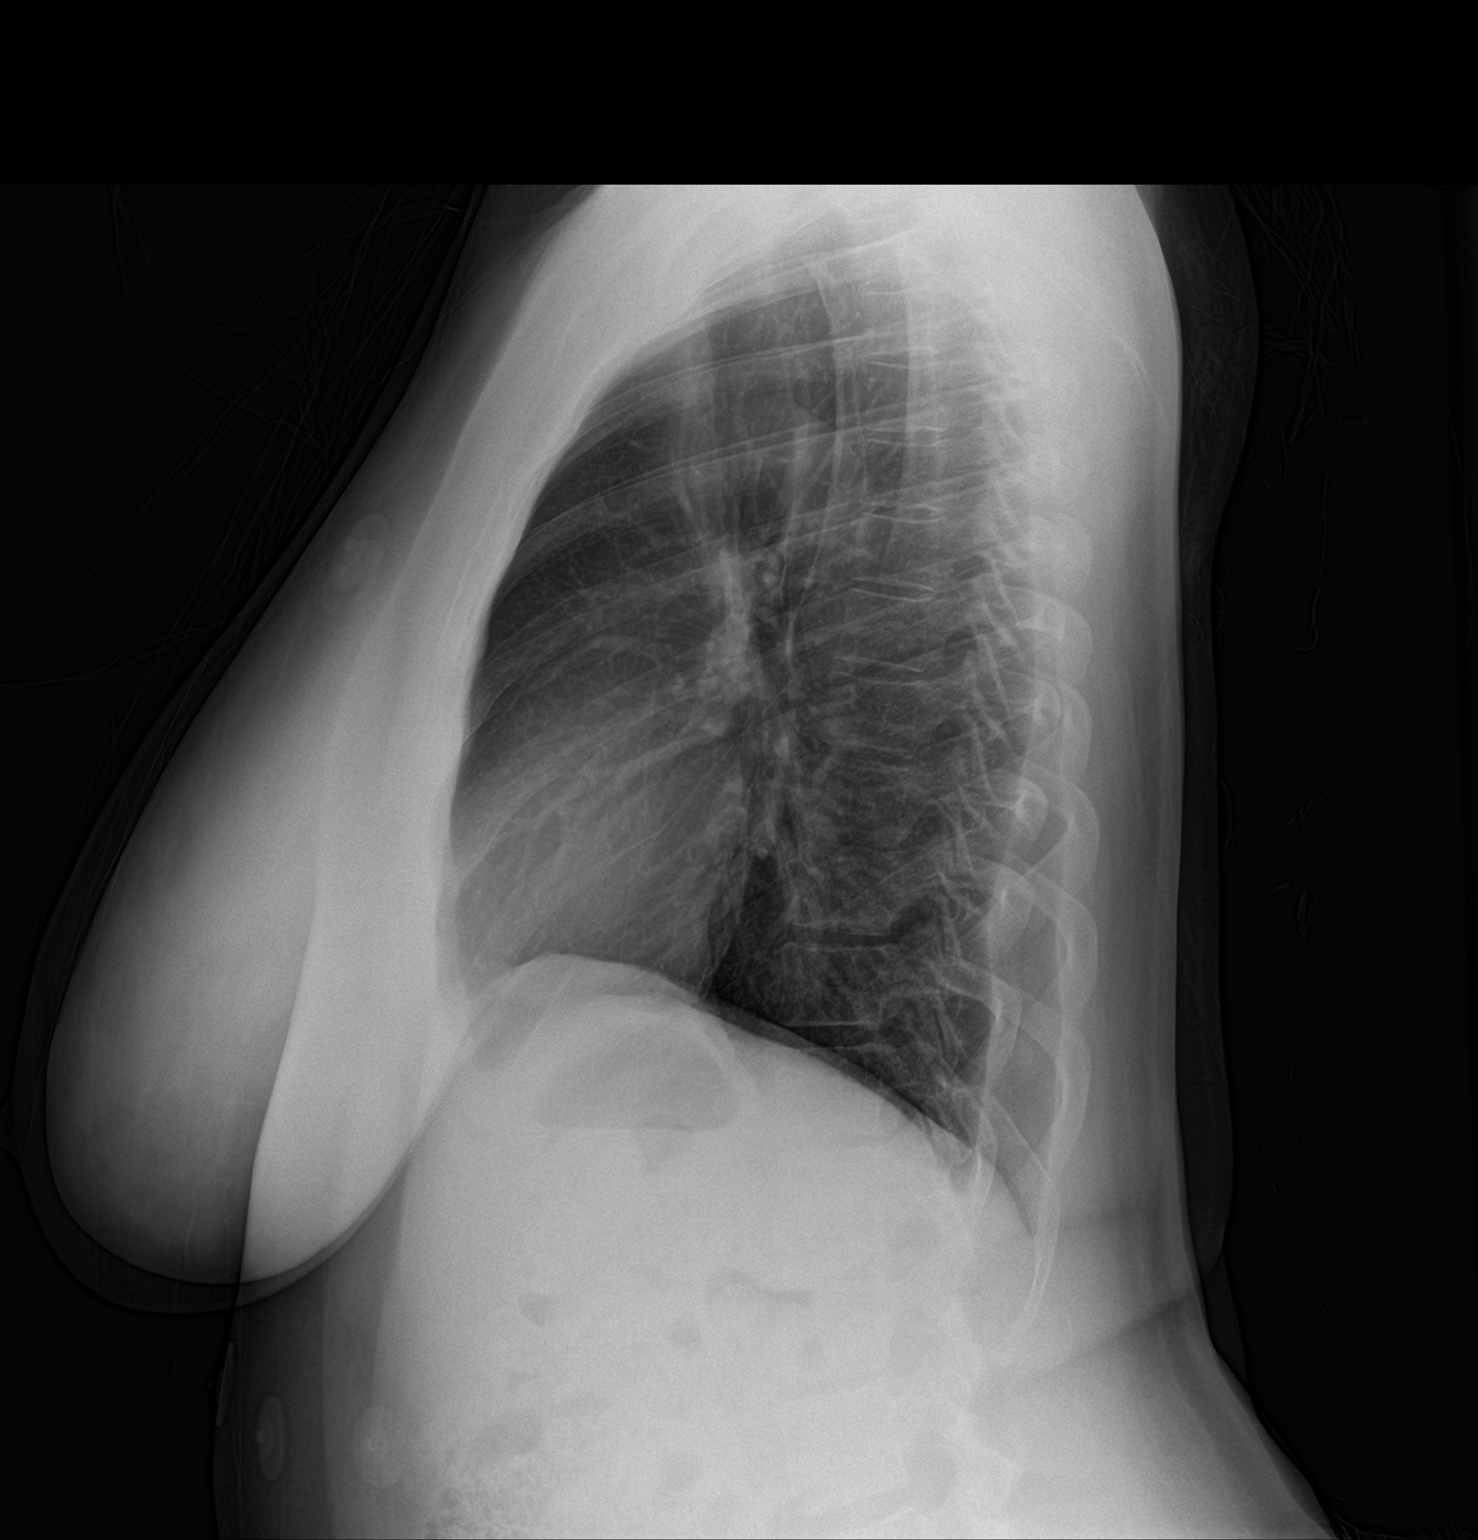

[2 of 2 positions shown; findings below may reference images not displayed]

FINDINGS: The heart size and mediastinal contours are within normal limits.
Both lungs are clear. The visualized skeletal structures are
unremarkable.
IMPRESSION: No active cardiopulmonary disease.

## 2020-11-09 ENCOUNTER — Encounter (HOSPITAL_COMMUNITY): Payer: Self-pay | Admitting: Emergency Medicine

## 2020-11-09 ENCOUNTER — Emergency Department (HOSPITAL_COMMUNITY)
Admission: EM | Admit: 2020-11-09 | Discharge: 2020-11-10 | Disposition: A | Discharge status: Home / Self Care | Payer: 59 | Attending: Emergency Medicine | Admitting: Emergency Medicine

## 2020-11-09 DIAGNOSIS — Z20822 Contact with and (suspected) exposure to covid-19: Secondary | ICD-10-CM | POA: Diagnosis not present

## 2020-11-09 DIAGNOSIS — Z79899 Other long term (current) drug therapy: Secondary | ICD-10-CM | POA: Diagnosis not present

## 2020-11-09 DIAGNOSIS — F32A Depression, unspecified: Secondary | ICD-10-CM | POA: Diagnosis not present

## 2020-11-09 DIAGNOSIS — R45851 Suicidal ideations: Secondary | ICD-10-CM | POA: Insufficient documentation

## 2020-11-09 DIAGNOSIS — F329 Major depressive disorder, single episode, unspecified: Secondary | ICD-10-CM | POA: Insufficient documentation

## 2020-11-09 DIAGNOSIS — T424X2A Poisoning by benzodiazepines, intentional self-harm, initial encounter: Secondary | ICD-10-CM

## 2020-11-09 LAB — CBC WITH DIFFERENTIAL/PLATELET
Abs Immature Granulocytes: 0.02 10*3/uL (ref 0.00–0.07)
Basophils Absolute: 0 10*3/uL (ref 0.0–0.1)
Basophils Relative: 1 %
Eosinophils Absolute: 0.1 10*3/uL (ref 0.0–0.5)
Eosinophils Relative: 2 %
HCT: 34.7 % — ABNORMAL LOW (ref 36.0–46.0)
Hemoglobin: 10 g/dL — ABNORMAL LOW (ref 12.0–15.0)
Immature Granulocytes: 1 %
Lymphocytes Relative: 43 %
Lymphs Abs: 1.9 10*3/uL (ref 0.7–4.0)
MCH: 21.4 pg — ABNORMAL LOW (ref 26.0–34.0)
MCHC: 28.8 g/dL — ABNORMAL LOW (ref 30.0–36.0)
MCV: 74.3 fL — ABNORMAL LOW (ref 80.0–100.0)
Monocytes Absolute: 0.5 10*3/uL (ref 0.1–1.0)
Monocytes Relative: 10 %
Neutro Abs: 1.9 10*3/uL (ref 1.7–7.7)
Neutrophils Relative %: 43 %
Platelets: 436 10*3/uL — ABNORMAL HIGH (ref 150–400)
RBC: 4.67 MIL/uL (ref 3.87–5.11)
RDW: 17.5 % — ABNORMAL HIGH (ref 11.5–15.5)
WBC: 4.3 10*3/uL (ref 4.0–10.5)
nRBC: 0 % (ref 0.0–0.2)

## 2020-11-09 LAB — CBG MONITORING, ED
Glucose-Capillary: 61 mg/dL — ABNORMAL LOW (ref 70–99)
Glucose-Capillary: 59 mg/dL — ABNORMAL LOW (ref 70–99)
Glucose-Capillary: 71 mg/dL (ref 70–99)

## 2020-11-09 LAB — COMPREHENSIVE METABOLIC PANEL
ALT: 14 U/L (ref 0–44)
AST: 22 U/L (ref 15–41)
Albumin: 4.8 g/dL (ref 3.5–5.0)
Alkaline Phosphatase: 53 U/L (ref 38–126)
Anion gap: 10 (ref 5–15)
BUN: 7 mg/dL (ref 6–20)
CO2: 24 mmol/L (ref 22–32)
Calcium: 9.4 mg/dL (ref 8.9–10.3)
Chloride: 103 mmol/L (ref 98–111)
Creatinine, Ser: 1.18 mg/dL — ABNORMAL HIGH (ref 0.44–1.00)
GFR, Estimated: 59 mL/min — ABNORMAL LOW (ref >60)
Glucose, Bld: 67 mg/dL — ABNORMAL LOW (ref 70–99)
Potassium: 3.4 mmol/L — ABNORMAL LOW (ref 3.5–5.1)
Sodium: 137 mmol/L (ref 135–145)
Total Bilirubin: 0.4 mg/dL (ref 0.3–1.2)
Total Protein: 8.4 g/dL — ABNORMAL HIGH (ref 6.5–8.1)

## 2020-11-09 LAB — RESP PANEL BY RT-PCR (FLU A&B, COVID) ARPGX2
Influenza A by PCR: NEGATIVE
Influenza B by PCR: NEGATIVE
SARS Coronavirus 2 by RT PCR: NEGATIVE

## 2020-11-09 LAB — ETHANOL: Alcohol, Ethyl (B): <10 mg/dL (ref <10)

## 2020-11-09 LAB — I-STAT BETA HCG BLOOD, ED (MC, WL, AP ONLY): I-stat hCG, quantitative: <5 m[IU]/mL (ref <5)

## 2020-11-09 LAB — SALICYLATE LEVEL: Salicylate Lvl: <7 mg/dL — ABNORMAL LOW (ref 7.0–30.0)

## 2020-11-09 LAB — ACETAMINOPHEN LEVEL: Acetaminophen (Tylenol), Serum: <10 ug/mL — ABNORMAL LOW (ref 10–30)

## 2020-11-09 MED ORDER — GLUCOSE 40 % PO GEL
1.0000 | Freq: Once | ORAL | Status: AC
  Administered 2020-11-09: 37.5 g via ORAL
  Filled 2020-11-09: qty 1

## 2020-11-09 NOTE — ED Notes (Signed)
Per Copywriter, advertising, the patient has taken 10 sleeping pills last night and recently attempted SI with CO2 poisoning.  Patient has been informed again that she is not allowed to leave the hospital with discharge papers.

## 2020-11-09 NOTE — ED Notes (Signed)
Pt has been changed into scrubs and all personal belonging was placed into 1 bag and placed under the desk in triage

## 2020-11-09 NOTE — ED Notes (Signed)
Checked pt's CBG per order: 71 Pt stated that she now wants an IV to help get her sugar up RN Aggie Cosier informed of same

## 2020-11-09 NOTE — ED Provider Notes (Signed)
[facility]-EMERGENCY DEPT Provider Note   CSN: [id number] Arrival date & time: 11/09/20  1705     History Chief Complaint  Patient presents with  . Suicidal  . IVC    Cassandra Mcdaniel is a 44 y.o. female history of depression, kidney stones, migraine, acid reflux, kidney stones.  Patient brought in today by [facility] following suicide attempt.  Patient has been placed under IVC by [facility] police.  Patient reports that she took around 10-15 sleeping pills, temazepam 15 mg at 930 last night in the hopes that she would not wake up this morning.  She also reports that she tried to kill herself 4 or 5 days ago by leaving her car running in the garage and setting it until she lost consciousness.  Patient had divulged these thoughts to her cousin who called law enforcement to evaluate the patient.  Patient reports that she just wants to die but does not give a clear reason.  For these behaviors.  No history of fever/chills, fall/injuries, cutting, headache, vision changes, confusion/syncope, chest pain/shortness breath, abdominal pain, nausea/vomiting, diarrhea, dysuria/hematuria, homicidal Nations, hallucinations, drug/alcohol use or any additional concerns.  Additionally patient reports that she only drinks socially not daily and has never had alcohol withdrawal.  HPI     Past Medical History:  Diagnosis Date  . Chronic constipation   . Depression   . H/O cardiac murmur    PT DENIES S & S   . History of kidney stones   . Migraine   . Mild acid reflux    DIET CONTROLLED  . Right ureteral calculus   . Urgency of urination   . Vitamin D deficiency   . Wears contact lenses     Patient Active Problem List   Diagnosis Date Noted  . Ureteral calculi 01/21/2014    Past Surgical History:  Procedure Laterality Date  . CYSTOSCOPY WITH RETROGRADE PYELOGRAM, URETEROSCOPY AND STENT PLACEMENT Right 01/21/2014   Procedure: CYSTOSCOPY WITH RETROGRADE  PYELOGRAM, URETEROSCOPY AND  STENT PLACEMENT;  Surgeon: Valetta Fuller, MD;  Location: [facility];  Service: Urology;  Laterality: Right;  . HOLMIUM LASER APPLICATION Right 01/21/2014   Procedure: HOLMIUM LASER APPLICATION;  Surgeon: Valetta Fuller, MD;  Location: [facility];  Service: Urology;  Laterality: Right;     OB History   No obstetric history on file.     Family History  Problem Relation Age of Onset  . Hyperlipidemia Mother   . Hypertension Mother   . Diabetes Mother   . Heart Problems Father   . Hyperlipidemia Father   . Hypertension Father     Social History   Tobacco Use  . Smoking status: Never Smoker  . Smokeless tobacco: Never Used  Vaping Use  . Vaping Use: Never used  Substance Use Topics  . Alcohol use: Yes    Comment: Ocassionally   . Drug use: No    Home Medications Prior to Admission medications   Medication Sig Start Date End Date Taking? Authorizing Provider  amitriptyline (ELAVIL) 25 MG tablet Take 25 mg by mouth daily.  10/18/20  Yes [provider]  atorvastatin (LIPITOR) 10 MG tablet Take 10 mg by mouth daily. 03/22/18  Yes [provider]  Biotin w/ Vitamins C & E (HAIR/SKIN/NAILS PO) Take 1 tablet by mouth daily.   Yes [provider]  buPROPion (WELLBUTRIN XL) 150 MG 24 hr tablet Take 150 mg by mouth daily.  10/16/20  Yes [provider]  Cholecalciferol (VITAMIN D3) 50000 UNITS CAPS Take 1 capsule by mouth once a week.   Yes [provider]  folic acid (FOLVITE) 1 MG tablet Take 1 mg by mouth daily.   Yes [provider]  Galcanezumab-gnlm (EMGALITY) 120 MG/ML SOAJ Inject 120 mg into the skin every 30 (thirty) days.  02/22/20 02/21/21 Yes [provider]  ibuprofen (ADVIL,MOTRIN) 800 MG tablet Take 1 tablet (800 mg total) by mouth 3 (three) times daily. Patient taking differently: Take 800 mg by mouth daily as needed for headache.  01/21/18  Yes Burky,  Dorene Grebe B, NP  ketorolac (TORADOL) 10 MG tablet Take 10 mg by mouth every 6 (six) hours as needed for severe pain.  02/25/18  Yes [provider]  Lactobacillus Rhamnosus, GG, (PROBIOTIC COLIC) LIQD Take 1 Container by mouth daily.   Yes [provider]  Linaclotide (LINZESS) 290 MCG CAPS capsule Take 290 mcg by mouth every evening.   Yes [provider]  Multiple Vitamin (MULTIVITAMIN WITH MINERALS) TABS tablet Take 1 tablet by mouth daily.   Yes [provider]  promethazine (PHENERGAN) 25 MG tablet Take 1 tablet (25 mg total) by mouth every 6 (six) hours as needed for nausea or vomiting. 03/24/18  Yes Harris, Abigail, PA-C  sertraline (ZOLOFT) 100 MG tablet Take 150 mg by mouth daily. 03/22/18  Yes [provider]  tamsulosin (FLOMAX) 0.4 MG CAPS capsule Take 1 capsule (0.4 mg total) by mouth 2 (two) times daily. Patient taking differently: Take 0.4 mg by mouth daily as needed (Pain).  03/24/18  Yes Harris, Abigail, PA-C  temazepam (RESTORIL) 15 MG capsule Take 15 mg by mouth at bedtime as needed for sleep. 02/24/18  Yes [provider]  tiZANidine (ZANAFLEX) 4 MG capsule Take 4 mg by mouth 3 (three) times daily as needed for muscle spasms.   Yes [provider]  valACYclovir (VALTREX) 1000 MG tablet Take 1,000 mg by mouth daily. 10/25/20  Yes [provider]  HYDROcodone-acetaminophen (NORCO/VICODIN) 5-325 MG tablet Take 2 tablets by mouth every 4 (four) hours as needed. Patient not taking: Reported on 11/09/2020 01/21/18   Linus Mako B, NP  ondansetron (ZOFRAN) 4 MG tablet Take 1 tablet (4 mg total) by mouth every 8 (eight) hours as needed for nausea or vomiting. Patient not taking: Reported on 11/09/2020 01/21/18   Linus Mako B, NP  oxyCODONE-acetaminophen (PERCOCET) 5-325 MG tablet Take 1-2 tablets by mouth every 4 (four) hours as needed. Patient not taking: Reported on 11/09/2020 03/24/18   Arthor Captain, PA-C   tamsulosin (FLOMAX) 0.4 MG CAPS capsule Take 1 capsule (0.4 mg total) by mouth daily. Patient not taking: Reported on 03/24/2018 01/21/18   Georgetta Haber, NP    Allergies    Imitrex [sumatriptan], Tape, and Topiramate  Review of Systems   Review of Systems Ten systems are reviewed and are negative for acute change except as noted in the HPI  Physical Exam Updated Vital Signs BP (!) 123/91   Pulse 95   Temp (!) 97.5 F (36.4 C) (Oral)   Resp 18   SpO2 100%   Physical Exam Constitutional:      General: She is not in acute distress.    Appearance: Normal appearance. She is well-developed. She is not ill-appearing or diaphoretic.  HENT:     Head: Normocephalic and atraumatic.  Eyes:     General: Vision grossly intact. Gaze aligned appropriately.     Pupils: Pupils  are equal, round, and reactive to light.  Neck:     Trachea: Trachea and phonation normal.  Pulmonary:     Effort: Pulmonary effort is normal. No respiratory distress.  Abdominal:     General: There is no distension.     Palpations: Abdomen is soft.     Tenderness: There is no abdominal tenderness. There is no guarding or rebound.  Musculoskeletal:        General: Normal range of motion.     Cervical back: Normal range of motion.  Skin:    General: Skin is warm and dry.  Neurological:     Mental Status: She is alert.     GCS: GCS eye subscore is 4. GCS verbal subscore is 5. GCS motor subscore is 6.     Comments: Speech is clear and goal oriented, follows commands Major Cranial nerves without deficit, no facial droop Moves extremities without ataxia, coordination intact  Psychiatric:        Mood and Affect: Mood is depressed. Affect is flat.        Speech: Speech normal.        Behavior: Behavior is withdrawn. Behavior is cooperative.        Thought Content: Thought content includes suicidal ideation. Thought content does not include homicidal ideation. Thought content includes suicidal plan.     ED  Results / Procedures / Treatments   Labs (all labs ordered are listed, but only abnormal results are displayed) Labs Reviewed  COMPREHENSIVE METABOLIC PANEL - Abnormal; Notable for the following components:      Result Value   Potassium 3.4 (*)    Glucose, Bld 67 (*)    Creatinine, Ser 1.18 (*)    Total Protein 8.4 (*)    GFR, Estimated 59 (*)    All other components within normal limits  CBC WITH DIFFERENTIAL/PLATELET - Abnormal; Notable for the following components:   Hemoglobin 10.0 (*)    HCT 34.7 (*)    MCV 74.3 (*)    MCH 21.4 (*)    MCHC 28.8 (*)    RDW 17.5 (*)    Platelets 436 (*)    All other components within normal limits  ACETAMINOPHEN LEVEL - Abnormal; Notable for the following components:   Acetaminophen (Tylenol), Serum <10 (*)    All other components within normal limits  SALICYLATE LEVEL - Abnormal; Notable for the following components:   Salicylate Lvl <7.0 (*)    All other components within normal limits  CBG MONITORING, ED - Abnormal; Notable for the following components:   Glucose-Capillary 61 (*)    All other components within normal limits  CBG MONITORING, ED - Abnormal; Notable for the following components:   Glucose-Capillary 59 (*)    All other components within normal limits  RESP PANEL BY RT-PCR (FLU A&B, COVID) ARPGX2  ETHANOL  RAPID URINE DRUG SCREEN, HOSP PERFORMED  I-STAT BETA HCG BLOOD, ED ([facility], [facility], [facility] ONLY)  CBG MONITORING, ED    EKG EKG Interpretation  Date/Time:  Tuesday November 09 2020 19:13:47 EST Ventricular Rate:  95 PR Interval:    QRS Duration: 111 QT Interval:  365 QTC Calculation: 459 R Axis:   1 Text Interpretation: Sinus rhythm Low voltage, precordial leads 12 Lead; Mason-Likar Confirmed by Tilden Fossa [phone]) on 11/09/2020 7:16:49 PM   Radiology No results found.  Procedures Procedures (including critical care time)  Medications Ordered in ED Medications  dextrose (GLUTOSE) 40 % oral gel 37.5 g (37.5 g  Oral  Given 11/09/20 2227)    ED Course  I have reviewed the triage vital signs and the nursing notes.  Pertinent labs & imaging results that were available during my care of the patient were reviewed by me and considered in my medical decision making (see chart for details).    MDM Rules/Calculators/A&P                         Additional history obtained from: 1. Nursing notes from this visit. 2. Law enforcement, their history in line with patient's given history. 3. Review of electronic medical records.  No prior behavioral health visits. ----- 44 year old female arrived today under IVC with law enforcement.  At 930 last night she took 10 to 15 pills of her 15 mg temazepam to take her own life.  She reports that she is feeling well at time of arrival she has no complaints or concerns but she does express continued suicidal ideations.  She also attempted to take her own life for 5 days ago with carbon monoxide poisoning after she left her car running in her garage and sat inside.  Will obtain medical clearance labs and EKG. Low suspicion for carbon oxide poisoning given has been for 4 or 5 days since that event.  5:38 PM: [employer] and spoke to [place].  Neysa Bonito advises to obtain EKG, Tylenol level and complete metabolic panel.  If these show no emergent derangements then patient would be medically cleared for psychiatry evaluation. - I ordered, reviewed and interpreted labs which include: Pregnancy test negative. Ethanol negative, patient does not appear intoxicated or in withdrawal. Salicylate and Tylenol levels negative, no evidence of toxic ingestion of the substances. CBC shows baseline hemoglobin of 10.0, no leukocytosis to suggest infection. CMP shows no emergent electrolyte derangement, AKI, LFT elevations or gap.  Creatinine of 1.18 appears similar to prior.  Glucose was noted to be 67, patient given food will recheck CBG.  EKG: Sinus rhythm Low  voltage, precordial leads 12 Lead; Mason-Likar Confirmed by Tilden Fossa [phone]) on 11/09/2020 7:16:49 PM - Received call back from [employer] at poison control, we discussed labs and EKG, Thayer Ohm advises patient medically cleared from the overdose standpoint.  I reassessed the patient she is sitting comfortably no acute distress in the bed reports she feels well.  Noted that her glucose on her CMP was still low at 67, and repeat CBG was ordered and I asked nursing staff to feed the patient.  Patient had refused food citing that she did not like what was offered and then they took a CBG and it was slightly lower at 61.  I have asked nursing staff to give her some orange juice and recheck prior to medical clearance. - Patient had refused to eat or drink in the ER stating that she did not like any of the food, she was then given a glucose tablet and CBG improved.  She denies any history of diabetes, suspect the documented hypoglycemia was due to patient reporting that she is not been eating as much secondary to her depression.  She has no abdominal pain nausea vomiting diarrhea polydipsia or polyuria or evidence for glycemic crisis.  At this time there does not appear to be any evidence of an acute emergency medical condition and the patient appears stable for psychiatric evaluation.  Remains under IVC and awaiting TTS consult.  Discussed case with Dr. Madilyn Hook who agrees with medical clearance.   Note: Portions  of this report may have been transcribed using voice recognition software. Every effort was made to ensure accuracy; however, inadvertent computerized transcription errors may still be present. Final Clinical Impression(s) / ED Diagnoses Final diagnoses:  Suicide attempt by benzodiazepine overdose [facility](HCC)    Rx / DC Orders ED Discharge Orders    None       Elizabeth PalauMorelli, Janeice Stegall A, PA-C 11/09/20 2253    Tilden Fossaees, Elizabeth, MD 11/09/20 [id number]

## 2020-11-09 NOTE — ED Triage Notes (Signed)
Per IVC paper work, states she has had multiple suicide attempts-per family states she took some temazepam 15 mg last night-possibly 10-15 pills in an attempt to harm self

## 2020-11-09 NOTE — ED Triage Notes (Signed)
Per pt, states she has been depressed for over 30 years-she just wants to "die"-states she has been in therapy and hospitalized for depression-states it is no one else's business on how she feels and it is between "herself and God"-took 10 sleeping pills last night-"to help her sleep and hurt herself...states she needs to leave so she can go to work tomorrow

## 2020-11-09 NOTE — ED Notes (Signed)
Patient now refusing to eat/drink

## 2020-11-09 NOTE — ED Notes (Addendum)
Patient given crackers, juice, and sprite per request.

## 2020-11-09 NOTE — ED Notes (Signed)
2 cups of orange juice has been provided.

## 2020-11-09 NOTE — ED Notes (Signed)
This RN provided hot chicken broth and crackers for that patient but she refused, provider made aware.

## 2020-11-10 ENCOUNTER — Encounter (HOSPITAL_COMMUNITY): Payer: Self-pay | Admitting: Registered Nurse

## 2020-11-10 LAB — CBG MONITORING, ED: Glucose-Capillary: 113 mg/dL — ABNORMAL HIGH (ref 70–99)

## 2020-11-10 NOTE — Consult Note (Signed)
Tele psych Assessment  Cassandra Mcdaniel, 44 y.o., female patient seen via tele psych by TTS and this provider; chart reviewed and consulted with Dr. Lucianne Muss on 11/10/20.  On evaluation Cassandra Mcdaniel reports she does have a history of depression.  Patient states "Burgess Estelle I was upset and had been talking and texting my friends and sister.  When I'm like that I can't get my mind to stop racing and it is hard for me to fall asleep.  I am prescribed Temazepam 15 mg.  I had taken one and wasn't able to fall asleep.  My doctor had discussed increasing the dosage to 30 mg; so I took another one.  I don't know I may have take 7 but it was to go to sleep; I wasn't trying to kill myself.  I have to be at work at 6:00 and I work to 2:00 PM."  Patient states that her sister may have been concerned and sent the police to her house as a safety check it was done and they left; then came back wanting to take me to the hospital to get checked out.  So I was here at the hospital from 5:00 pm to 12:00 am getting the run around from everybody, I'm getting more frustrated cause I got to got to work, can't get my phone to call in, the tech said she had to ask the nurse, the nurse had to ask the doctor nobody answering any question I was just fed up and was going to leave AMA but then somebody started talking about IVC.  I'm and Cassandra Mcdaniel myself and all I was asking for them to do was put in an IV to get my CBG up to where it needed to be and discharge me home so I could sleep few hours before I had to go to work."  Patient states that other than depression and anxiety she has no other psychiatric history; also denies prior suicide attempt.  Patient states that she lives along but has supportive family and friend.  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 Patient gave permission to speak with her mother for collateral information Cassandra Mcdaniel at [phone]) During evaluation Cassandra Mcdaniel is sitting up in chair; she is  alert/oriented x 4; calm/cooperative; and mood congruent with affect.  Patient is speaking in a clear tone at moderate volume, and normal pace; with good eye contact.  Her thought process is coherent and relevant; There is no indication that she is currently responding to internal/external stimuli or experiencing delusional thought content.  Patient denies suicidal/self-harm/homicidal ideation, psychosis, and paranoia.  Patient has remained calm throughout assessment and has answered questions appropriately.    Collateral Information:  This provider spoke with patients mother Cassandra Mcdaniel at [phone] via telephone.  Mrs. Urquidi states that her daughter does suffer from depression and when she is upset she will send text messages out to family and friends talking stupid stuff "Like, I don't want to be here no more.  I don't really feel that she would do anything to hurt or kill herself.  She is having problems with a man and feels that she is never [place] marry and gets down about that, but I do feel she needs to see someone to talk to like a therapist or something."  Mother states that she speaks with patient daily and face time several times a week.  States patient speaks with her sister everyday.  States that she feels that patient is  safe and not an imminent risk/threat to her self or anyone else.  "Tell her I will call her today and that I love her."    After thorough evaluation, collateral information, and review of information currently presented on assessment of Cassandra Mcdaniel, there is insufficient findings to indicate patient meets criteria for involuntary commitment or require an inpatient level of care. Cassandra Mcdaniel is alert/oriented x4, organized; mood congruent with affect; and denies suicidal/self-harm/homicidal ideation, psychosis, and paranoia.  At this time He/She is not significantly impaired, psychotic, or manic on exam.  At this time patient is educated and verbalizes  understanding of mental health resources and other crisis services in the community. She are instructed to call 911 and present to the nearest emergency room should they experience any suicidal/homicidal ideation, auditory/visual/hallucinations, or detrimental worsening of their mental health condition. Writer also advised the patient to call the toll free phone on insurance card to assist with identifying in network counselors and agencies  Suicide prevent also discussed with patient/mother;  Medication safety.     The suicide prevention education provided includes the following:  Suicide risk factors  Suicide prevention and interventions  National Suicide Hotline telephone number  [facility] assessment telephone number  [facility] Emergency Assistance 911  [facility] and/or Residential Mobile Crisis Unit telephone number   Request made of family/significant other to:  Remove weapons (e.g., guns, rifles, knives), all items previously/currently identified as safety concern.   Remove drugs/medications (over the counter, prescriptions, illicit drugs), all items previously/currently identified as a safety concern.   Recommendations:  Outpatient psychiatric services for medication management and therapy.  Patient may be interested in IOP or PHP program.    Patient will also need a letter to return to work tomorrow 10/12/20 since she has missed half of a work day today.   Disposition:  Patient is psychiatrically cleared No evidence of imminent risk to self or others at present.   Patient does not meet criteria for psychiatric inpatient admission. Supportive therapy provided about ongoing stressors. Refer to IOP. Discussed crisis plan, support from social network, calling 911, coming to the Emergency Department, and calling Suicide Hotline.  Secure message of recommendation and disposition sent to Dr. Freida Busman:  Patient psychiatrically cleared.  Oswaldo Done will speak to  patient about setting up with outpatient psychiatric services.  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    Assunta Found, NP

## 2020-11-10 NOTE — ED Notes (Signed)
CBG: 113 °

## 2020-11-10 NOTE — BH Assessment (Signed)
[facility] Assessment Progress Note  Per Shuvon Rankin, NP, this pt does not require psychiatric hospitalization at this time.  Pt presents under IVC initiated by a [employer] Counselor which has been rescinded by EDP Lorre Nick, MD.  Pt is psychiatrically cleared.  Shuvon recommends that pt follow up with an outpatient therapist and has requested that this writer discuss options with her.  I have spoken to pt about Partial Hospitalization, Mental Health Intensive Outpatient, and routine outpatient therapy.  She has chosen routine outpatient therapy and does not want to have an appointment scheduled for her, preferring instead to make arrangements at her own initiative.  Referrals have been entered into pt's discharge instructions.  Dr Freida Busman and pt's nurse, Alycia Rossetti, have been notified.  Doylene Canning, MA Triage Specialist [phone]

## 2020-11-10 NOTE — ED Notes (Signed)
Pt given discharge paper work and work note. Pt verbalizes instructions. [employer] called for transport home

## 2020-11-10 NOTE — ED Provider Notes (Addendum)
Patient been assessed by behavioral health today to decide disposition.  Patient is under IVC  Patient cleared for discharge by psychiatry   Lorre Nick, MD 11/10/20 8003    Lorre Nick, MD 11/10/20 1047

## 2020-11-10 NOTE — ED Notes (Signed)
Patient has been offered food and drink, again, she declined.

## 2020-11-10 NOTE — ED Notes (Signed)
The patient was concerned that she was not going to make it into work tomorrow morning, this RN called and spoke with the patient's supervisor, per patient. No medical information was provided, Jenniffer Lorn Junes was just made aware that the patient would not make it to work.  [phone].

## 2020-11-10 NOTE — BH Assessment (Signed)
Jason Berry, NP, recommends overnight observation for safety and stabilization with psych reassessment in the AM.   
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
## 2020-11-10 NOTE — Discharge Instructions (Signed)
For your behavioral health needs, you are advised to follow up with an outpatient therapist.  Contact one of the following providers at your earliest opportunity to schedule an intake appointment:       [facility]      [address]      [place], [place] [address]      [phone]       [facility]      [address]      [place], [place] [address]      [phone]       [facility]      [address]      [place], [place] [address]      [phone] 

## 2020-11-10 NOTE — ED Notes (Signed)
TTS consult in progress at this time. 

## 2023-11-12 ENCOUNTER — Other Ambulatory Visit: Payer: Self-pay

## 2023-11-12 MED ORDER — SERTRALINE HCL 100 MG PO TABS
150.0000 mg | ORAL_TABLET | Freq: Every day | ORAL | 3 refills | Status: AC
  Filled 2023-11-16: qty 135, 90d supply, fill #0

## 2023-11-12 MED ORDER — PROMETHAZINE HCL 25 MG PO TABS
25.0000 mg | ORAL_TABLET | Freq: Three times a day (TID) | ORAL | 2 refills | Status: AC | PRN
  Filled 2024-01-02 – 2024-01-03 (×2): qty 24, 8d supply, fill #0

## 2023-11-12 MED ORDER — VALACYCLOVIR HCL 1 G PO TABS
1000.0000 mg | ORAL_TABLET | Freq: Every day | ORAL | 11 refills | Status: AC
  Filled 2023-11-12: qty 30, 30d supply, fill #0
  Filled 2023-12-07: qty 30, 30d supply, fill #1
  Filled 2024-01-06: qty 30, 30d supply, fill #2
  Filled 2024-02-07 – 2024-02-17 (×2): qty 30, 30d supply, fill #3
  Filled 2024-03-15: qty 30, 30d supply, fill #0
  Filled 2024-03-15: qty 30, 30d supply, fill #4
  Filled 2024-03-22: qty 30, 30d supply, fill #0
  Filled 2024-04-25: qty 30, 30d supply, fill #1

## 2023-11-12 MED ORDER — PANTOPRAZOLE SODIUM 40 MG PO TBEC
40.0000 mg | DELAYED_RELEASE_TABLET | Freq: Every day | ORAL | 6 refills | Status: DC
  Filled 2023-11-23: qty 30, 30d supply, fill #0
  Filled 2024-01-02 – 2024-01-03 (×2): qty 30, 30d supply, fill #1
  Filled 2024-02-05 – 2024-02-17 (×2): qty 30, 30d supply, fill #2
  Filled 2024-03-22: qty 30, 30d supply, fill #3

## 2023-11-12 MED ORDER — HYDROXYZINE HCL 25 MG PO TABS
25.0000 mg | ORAL_TABLET | ORAL | 6 refills | Status: AC | PRN
  Filled 2023-12-07: qty 60, 30d supply, fill #0
  Filled 2024-01-22: qty 60, 30d supply, fill #1
  Filled 2024-03-01: qty 60, 30d supply, fill #2

## 2023-11-12 MED ORDER — NURTEC 75 MG PO TBDP
75.0000 mg | ORAL_TABLET | ORAL | 3 refills | Status: DC
  Filled 2024-01-22 – 2024-02-08 (×2): qty 16, 32d supply, fill #0

## 2023-11-12 MED ORDER — TIZANIDINE HCL 4 MG PO TABS
4.0000 mg | ORAL_TABLET | Freq: Three times a day (TID) | ORAL | 2 refills | Status: AC | PRN
  Filled 2024-01-02: qty 60, 20d supply, fill #0

## 2023-11-12 MED ORDER — LINACLOTIDE 290 MCG PO CAPS
290.0000 ug | ORAL_CAPSULE | Freq: Every day | ORAL | 5 refills | Status: AC
  Filled 2024-03-15 – 2024-03-22 (×3): qty 30, 30d supply, fill #0

## 2023-11-12 MED ORDER — BUPROPION HCL ER (XL) 150 MG PO TB24
150.0000 mg | ORAL_TABLET | Freq: Every morning | ORAL | 3 refills | Status: AC
  Filled 2023-11-16: qty 30, 30d supply, fill #0
  Filled 2024-01-02 – 2024-01-03 (×2): qty 30, 30d supply, fill #1
  Filled 2024-02-05 – 2024-02-17 (×2): qty 30, 30d supply, fill #2
  Filled 2024-07-01: qty 30, 30d supply, fill #3

## 2023-11-16 ENCOUNTER — Other Ambulatory Visit: Payer: Self-pay

## 2023-11-20 ENCOUNTER — Other Ambulatory Visit: Payer: Self-pay

## 2023-11-23 ENCOUNTER — Other Ambulatory Visit: Payer: Self-pay

## 2023-11-26 ENCOUNTER — Other Ambulatory Visit: Payer: Self-pay

## 2023-12-10 ENCOUNTER — Other Ambulatory Visit: Payer: Self-pay

## 2023-12-11 ENCOUNTER — Other Ambulatory Visit: Payer: Self-pay

## 2024-01-03 ENCOUNTER — Other Ambulatory Visit: Payer: Self-pay

## 2024-01-07 ENCOUNTER — Other Ambulatory Visit: Payer: Self-pay

## 2024-01-23 ENCOUNTER — Other Ambulatory Visit: Payer: Self-pay

## 2024-01-24 ENCOUNTER — Other Ambulatory Visit: Payer: Self-pay

## 2024-01-28 ENCOUNTER — Other Ambulatory Visit: Payer: Self-pay

## 2024-02-06 ENCOUNTER — Other Ambulatory Visit: Payer: Self-pay

## 2024-02-07 ENCOUNTER — Other Ambulatory Visit: Payer: Self-pay

## 2024-02-08 ENCOUNTER — Other Ambulatory Visit: Payer: Self-pay

## 2024-02-11 ENCOUNTER — Other Ambulatory Visit: Payer: Self-pay

## 2024-02-12 ENCOUNTER — Other Ambulatory Visit: Payer: Self-pay

## 2024-03-05 ENCOUNTER — Other Ambulatory Visit: Payer: Self-pay

## 2024-03-05 DIAGNOSIS — Z01419 Encounter for gynecological examination (general) (routine) without abnormal findings: Secondary | ICD-10-CM | POA: Diagnosis not present

## 2024-03-05 DIAGNOSIS — G43909 Migraine, unspecified, not intractable, without status migrainosus: Secondary | ICD-10-CM | POA: Diagnosis not present

## 2024-03-05 DIAGNOSIS — F419 Anxiety disorder, unspecified: Secondary | ICD-10-CM | POA: Diagnosis not present

## 2024-03-05 DIAGNOSIS — G43019 Migraine without aura, intractable, without status migrainosus: Secondary | ICD-10-CM | POA: Diagnosis not present

## 2024-03-05 DIAGNOSIS — A6000 Herpesviral infection of urogenital system, unspecified: Secondary | ICD-10-CM | POA: Diagnosis not present

## 2024-03-05 MED ORDER — NURTEC 75 MG PO TBDP
75.0000 mg | ORAL_TABLET | Freq: Every day | ORAL | 11 refills | Status: DC | PRN
  Filled 2024-05-24: qty 8, 30d supply, fill #0

## 2024-03-05 MED ORDER — QULIPTA 30 MG PO TABS
1.0000 | ORAL_TABLET | Freq: Every day | ORAL | 6 refills | Status: DC
  Filled 2024-03-05 – 2024-04-25 (×4): qty 30, 30d supply, fill #0
  Filled 2024-07-01: qty 30, 30d supply, fill #1

## 2024-03-06 ENCOUNTER — Other Ambulatory Visit: Payer: Self-pay

## 2024-03-10 ENCOUNTER — Encounter (HOSPITAL_COMMUNITY): Payer: Self-pay

## 2024-03-10 ENCOUNTER — Other Ambulatory Visit (HOSPITAL_COMMUNITY): Payer: Self-pay

## 2024-03-10 ENCOUNTER — Other Ambulatory Visit: Payer: Self-pay

## 2024-03-10 MED ORDER — ATORVASTATIN CALCIUM 10 MG PO TABS
10.0000 mg | ORAL_TABLET | Freq: Every day | ORAL | 5 refills | Status: AC
  Filled 2024-03-10 – 2024-03-11 (×3): qty 30, 30d supply, fill #0
  Filled 2024-08-06: qty 30, 30d supply, fill #1

## 2024-03-10 MED ORDER — VITAMIN D3 1.25 MG (50000 UT) PO CAPS
50000.0000 [IU] | ORAL_CAPSULE | ORAL | 5 refills | Status: AC
  Filled 2024-03-10 – 2024-03-11 (×3): qty 4, 28d supply, fill #0
  Filled 2024-12-17: qty 4, 28d supply, fill #1

## 2024-03-11 ENCOUNTER — Other Ambulatory Visit (HOSPITAL_COMMUNITY): Payer: Self-pay

## 2024-03-11 ENCOUNTER — Other Ambulatory Visit: Payer: Self-pay

## 2024-03-13 ENCOUNTER — Other Ambulatory Visit: Payer: Self-pay | Admitting: Family Medicine

## 2024-03-13 DIAGNOSIS — R519 Headache, unspecified: Secondary | ICD-10-CM

## 2024-03-13 DIAGNOSIS — G43019 Migraine without aura, intractable, without status migrainosus: Secondary | ICD-10-CM

## 2024-03-15 ENCOUNTER — Other Ambulatory Visit (HOSPITAL_COMMUNITY): Payer: Self-pay

## 2024-03-15 ENCOUNTER — Other Ambulatory Visit (HOSPITAL_BASED_OUTPATIENT_CLINIC_OR_DEPARTMENT_OTHER): Payer: Self-pay

## 2024-03-17 ENCOUNTER — Other Ambulatory Visit: Payer: Self-pay

## 2024-03-18 ENCOUNTER — Other Ambulatory Visit: Payer: Self-pay

## 2024-03-19 ENCOUNTER — Other Ambulatory Visit: Payer: Self-pay

## 2024-03-20 ENCOUNTER — Other Ambulatory Visit: Payer: Self-pay

## 2024-03-21 ENCOUNTER — Other Ambulatory Visit: Payer: Self-pay

## 2024-03-24 ENCOUNTER — Other Ambulatory Visit: Payer: Self-pay

## 2024-03-24 ENCOUNTER — Other Ambulatory Visit (HOSPITAL_COMMUNITY): Payer: Self-pay

## 2024-03-25 ENCOUNTER — Other Ambulatory Visit: Payer: Self-pay

## 2024-03-28 ENCOUNTER — Other Ambulatory Visit: Payer: Self-pay

## 2024-04-01 ENCOUNTER — Other Ambulatory Visit: Payer: Self-pay

## 2024-04-03 ENCOUNTER — Other Ambulatory Visit: Payer: Self-pay

## 2024-04-04 ENCOUNTER — Encounter: Payer: Self-pay | Admitting: Family Medicine

## 2024-04-04 ENCOUNTER — Other Ambulatory Visit (HOSPITAL_COMMUNITY): Payer: Self-pay

## 2024-04-04 DIAGNOSIS — E559 Vitamin D deficiency, unspecified: Secondary | ICD-10-CM | POA: Diagnosis not present

## 2024-04-04 DIAGNOSIS — E7800 Pure hypercholesterolemia, unspecified: Secondary | ICD-10-CM | POA: Diagnosis not present

## 2024-04-04 DIAGNOSIS — F5101 Primary insomnia: Secondary | ICD-10-CM | POA: Diagnosis not present

## 2024-04-04 DIAGNOSIS — F331 Major depressive disorder, recurrent, moderate: Secondary | ICD-10-CM | POA: Diagnosis not present

## 2024-04-04 DIAGNOSIS — E611 Iron deficiency: Secondary | ICD-10-CM | POA: Diagnosis not present

## 2024-04-04 DIAGNOSIS — F419 Anxiety disorder, unspecified: Secondary | ICD-10-CM | POA: Diagnosis not present

## 2024-04-04 DIAGNOSIS — G43109 Migraine with aura, not intractable, without status migrainosus: Secondary | ICD-10-CM | POA: Diagnosis not present

## 2024-04-04 DIAGNOSIS — Z0000 Encounter for general adult medical examination without abnormal findings: Secondary | ICD-10-CM | POA: Diagnosis not present

## 2024-04-04 DIAGNOSIS — R7303 Prediabetes: Secondary | ICD-10-CM | POA: Diagnosis not present

## 2024-04-04 MED ORDER — BUPROPION HCL ER (XL) 150 MG PO TB24
150.0000 mg | ORAL_TABLET | Freq: Every morning | ORAL | 3 refills | Status: AC
  Filled 2024-04-04: qty 90, 90d supply, fill #0

## 2024-04-04 MED ORDER — VITAMIN D3 1.25 MG (50000 UT) PO CAPS
50000.0000 [IU] | ORAL_CAPSULE | ORAL | 3 refills | Status: AC
  Filled 2024-04-04: qty 12, 84d supply, fill #0
  Filled 2024-08-06: qty 12, 84d supply, fill #1

## 2024-04-04 MED ORDER — HYDROXYZINE HCL 25 MG PO TABS
25.0000 mg | ORAL_TABLET | Freq: Every day | ORAL | 3 refills | Status: AC | PRN
  Filled 2024-04-04: qty 180, 90d supply, fill #0
  Filled 2024-08-06: qty 180, 90d supply, fill #1
  Filled 2024-12-17: qty 180, 90d supply, fill #2

## 2024-04-04 MED ORDER — PANTOPRAZOLE SODIUM 40 MG PO TBEC
40.0000 mg | DELAYED_RELEASE_TABLET | Freq: Every day | ORAL | 3 refills | Status: AC
  Filled 2024-04-04 – 2024-04-25 (×2): qty 90, 90d supply, fill #0
  Filled 2024-08-31: qty 90, 90d supply, fill #1
  Filled 2024-12-17: qty 90, 90d supply, fill #2

## 2024-04-04 MED ORDER — SERTRALINE HCL 100 MG PO TABS
150.0000 mg | ORAL_TABLET | Freq: Every day | ORAL | 3 refills | Status: AC
  Filled 2024-04-04: qty 135, 90d supply, fill #0
  Filled 2024-08-06: qty 135, 90d supply, fill #1
  Filled 2024-12-17: qty 135, 90d supply, fill #2

## 2024-04-04 MED ORDER — VALACYCLOVIR HCL 1 G PO TABS
1000.0000 mg | ORAL_TABLET | Freq: Every day | ORAL | 3 refills | Status: AC
  Filled 2024-04-04 – 2024-06-12 (×2): qty 90, 90d supply, fill #0
  Filled 2024-10-20: qty 90, 90d supply, fill #1

## 2024-04-04 MED ORDER — ATORVASTATIN CALCIUM 10 MG PO TABS
10.0000 mg | ORAL_TABLET | Freq: Every day | ORAL | 3 refills | Status: AC
  Filled 2024-04-04: qty 90, 90d supply, fill #0
  Filled 2024-10-20: qty 90, 90d supply, fill #1

## 2024-04-06 ENCOUNTER — Other Ambulatory Visit: Payer: Self-pay

## 2024-04-08 ENCOUNTER — Other Ambulatory Visit: Payer: Self-pay

## 2024-04-08 ENCOUNTER — Other Ambulatory Visit (HOSPITAL_COMMUNITY): Payer: Self-pay

## 2024-04-09 ENCOUNTER — Other Ambulatory Visit: Payer: Self-pay

## 2024-04-19 ENCOUNTER — Other Ambulatory Visit

## 2024-04-20 ENCOUNTER — Other Ambulatory Visit

## 2024-04-24 ENCOUNTER — Other Ambulatory Visit: Payer: Self-pay

## 2024-04-26 ENCOUNTER — Other Ambulatory Visit: Payer: Self-pay

## 2024-05-09 ENCOUNTER — Encounter: Payer: Self-pay | Admitting: Oncology

## 2024-05-09 ENCOUNTER — Inpatient Hospital Stay

## 2024-05-09 ENCOUNTER — Other Ambulatory Visit: Payer: Self-pay | Admitting: Oncology

## 2024-05-09 ENCOUNTER — Inpatient Hospital Stay: Attending: Oncology | Admitting: Oncology

## 2024-05-09 VITALS — BP 113/82 | HR 83 | Temp 97.8°F | Resp 17 | Ht 62.0 in | Wt 144.9 lb

## 2024-05-09 DIAGNOSIS — D509 Iron deficiency anemia, unspecified: Secondary | ICD-10-CM

## 2024-05-09 DIAGNOSIS — G43909 Migraine, unspecified, not intractable, without status migrainosus: Secondary | ICD-10-CM | POA: Diagnosis not present

## 2024-05-09 DIAGNOSIS — D500 Iron deficiency anemia secondary to blood loss (chronic): Secondary | ICD-10-CM | POA: Diagnosis not present

## 2024-05-09 DIAGNOSIS — E559 Vitamin D deficiency, unspecified: Secondary | ICD-10-CM | POA: Diagnosis not present

## 2024-05-09 DIAGNOSIS — K581 Irritable bowel syndrome with constipation: Secondary | ICD-10-CM | POA: Insufficient documentation

## 2024-05-09 DIAGNOSIS — K589 Irritable bowel syndrome without diarrhea: Secondary | ICD-10-CM | POA: Insufficient documentation

## 2024-05-09 DIAGNOSIS — Z79899 Other long term (current) drug therapy: Secondary | ICD-10-CM | POA: Insufficient documentation

## 2024-05-09 DIAGNOSIS — K219 Gastro-esophageal reflux disease without esophagitis: Secondary | ICD-10-CM | POA: Insufficient documentation

## 2024-05-09 LAB — CBC WITH DIFFERENTIAL (CANCER CENTER ONLY)
Abs Immature Granulocytes: 0.01 10*3/uL (ref 0.00–0.07)
Basophils Absolute: 0 10*3/uL (ref 0.0–0.1)
Basophils Relative: 1 %
Eosinophils Absolute: 0 10*3/uL (ref 0.0–0.5)
Eosinophils Relative: 1 %
HCT: 25.3 % — ABNORMAL LOW (ref 36.0–46.0)
Hemoglobin: 7.6 g/dL — ABNORMAL LOW (ref 12.0–15.0)
Immature Granulocytes: 0 %
Lymphocytes Relative: 46 %
Lymphs Abs: 2 10*3/uL (ref 0.7–4.0)
MCH: 18.6 pg — ABNORMAL LOW (ref 26.0–34.0)
MCHC: 30 g/dL (ref 30.0–36.0)
MCV: 62 fL — ABNORMAL LOW (ref 80.0–100.0)
Monocytes Absolute: 0.2 10*3/uL (ref 0.1–1.0)
Monocytes Relative: 5 %
Neutro Abs: 2.1 10*3/uL (ref 1.7–7.7)
Neutrophils Relative %: 47 %
Platelet Count: 493 10*3/uL — ABNORMAL HIGH (ref 150–400)
RBC: 4.08 MIL/uL (ref 3.87–5.11)
RDW: 19.3 % — ABNORMAL HIGH (ref 11.5–15.5)
WBC Count: 4.3 10*3/uL (ref 4.0–10.5)
nRBC: 0 % (ref 0.0–0.2)

## 2024-05-09 LAB — IRON AND IRON BINDING CAPACITY (CC-WL,HP ONLY)
Iron: 21 ug/dL — ABNORMAL LOW (ref 28–170)
Saturation Ratios: 4 % — ABNORMAL LOW (ref 10.4–31.8)
TIBC: 559 ug/dL — ABNORMAL HIGH (ref 250–450)
UIBC: 538 ug/dL — ABNORMAL HIGH (ref 148–442)

## 2024-05-09 LAB — CMP (CANCER CENTER ONLY)
ALT: 8 U/L (ref 0–44)
AST: 17 U/L (ref 15–41)
Albumin: 4.7 g/dL (ref 3.5–5.0)
Alkaline Phosphatase: 51 U/L (ref 38–126)
Anion gap: 7 (ref 5–15)
BUN: 9 mg/dL (ref 6–20)
CO2: 28 mmol/L (ref 22–32)
Calcium: 9.4 mg/dL (ref 8.9–10.3)
Chloride: 103 mmol/L (ref 98–111)
Creatinine: 0.92 mg/dL (ref 0.44–1.00)
GFR, Estimated: >60 mL/min (ref >60)
Glucose, Bld: 84 mg/dL (ref 70–99)
Potassium: 3.3 mmol/L — ABNORMAL LOW (ref 3.5–5.1)
Sodium: 138 mmol/L (ref 135–145)
Total Bilirubin: 0.7 mg/dL (ref 0.0–1.2)
Total Protein: 8.1 g/dL (ref 6.5–8.1)

## 2024-05-09 LAB — VITAMIN B12: Vitamin B-12: 190 pg/mL (ref 180–914)

## 2024-05-09 LAB — FERRITIN: Ferritin: 5 ng/mL — ABNORMAL LOW (ref 11–307)

## 2024-05-09 LAB — FOLATE: Folate: 7 ng/mL (ref >5.9)

## 2024-05-09 NOTE — Progress Notes (Signed)
 [facility]  HEMATOLOGY CLINIC CONSULTATION NOTE   PATIENT NAME: Cassandra Mcdaniel   MR#: [id number] DOB: [birth year]  DATE OF SERVICE: 05/09/2024  Patient Care Team: Lanae Pinal, MD as PCP - General (Family Medicine)  REASON FOR CONSULTATION/ CHIEF COMPLAINT:  Evaluation of anemia.  ASSESSMENT & PLAN:   Cassandra Mcdaniel is a 48 y.o. lady with a past medical history of IBS, migraines, depression, GERD, vitamin D deficiency, kidney stones, was referred to our service for evaluation of iron deficiency anemia.    Iron deficiency anemia due to chronic blood loss Chronic iron deficiency anemia with hemoglobin at 7.5 g/dL, presenting with fatigue, dizziness, and occasional chest pain. IBS with constipation complicates oral iron supplementation.   Anemia likely due to iron deficiency, supported by pica for ice chips and low iron levels. No heavy menstrual bleeding or other blood loss sources. Normal previous colonoscopy. Recommended IV iron infusion to increase hemoglobin and alleviate symptoms.   Labs today showed hemoglobin of 7.6, MCV decreased at 62.  White count 4200 with normal differential.  Platelet count 493,000, likely reactionary from iron deficiency state.  Iron labs, ferritin are pending from today.  Given recent issues with iron deficiency and inability to tolerate oral iron, we will proceed with IV iron.  We got approval to proceed with Venofer weekly x 5 doses.  Will arrange infusions at our infusion center on [address].  - Coordinate with [employer] for IV iron infusion scheduling.  - Consider blood transfusion if hemoglobin drops below 7 g/dL.  - Reassess blood work in three months to evaluate treatment response.  IBS (irritable bowel syndrome) Chronic IBS with constipation limiting oral iron supplement use. Managed with Linzess .  GERD without esophagitis Chronic GERD managed with Protonix , with significant acid reflux  symptoms reported.   Since the cause of anemia seems to be obvious from iron deficiency, I am not pursuing extensive workup at this time.  If inadequate response to iron replacement is noted, we will pursue workup to rule out other etiologies.   I reviewed lab results and outside records for this visit and discussed relevant results with the patient. Diagnosis, plan of care and treatment options were also discussed in detail with the patient. Opportunity provided to ask questions and answers provided to her apparent satisfaction. Provided instructions to call our clinic with any problems, questions or concerns prior to return visit. I recommended to continue follow-up with PCP and sub-specialists. She verbalized understanding and agreed with the plan. No barriers to learning was detected.  Ardelle Haliburton, MD  CANCER CENTER [facility] - A DEPT OF [facility] [address] [place] [address] Dept: [phone] Dept Fax: [phone]  05/09/2024 3:51 PM  HISTORY OF PRESENT ILLNESS:  Discussed the use of AI scribe software for clinical note transcription with the patient, who gave verbal consent to proceed.  History of Present Illness Cassandra Mcdaniel is a 48 year old female with chronic anemia who presents for evaluation of low hemoglobin levels. She was referred by Dr. Theodis Fiscal for evaluation of her anemia.  She has chronic anemia with recent worsening, as her hemoglobin dropped to 7.5 g/dL.On 04/04/2024, labs at her PCPs office showed hemoglobin of 7.5, hematocrit 25.5, MCV 65.  White count 4400 with normal differential.  Platelet count increased at 597,000.  She experiences persistent fatigue, dizziness multiple times a day, and occasional short-lived chest pain.  This is her first experience with hemoglobin levels this low, and she has not received any blood transfusions in the past.  Her history of IBS with constipation prevents her from  taking oral iron supplements. She has not tried IV iron infusions before. Her iron levels were found to be low. She has cravings for ice chips, which can be indicative of iron deficiency.  She experiences heavy menstrual bleeding on the first day of her cycle, which becomes lighter afterward. No other bleeding issues such as blood in stools, black stools, epistaxis, or gum bleeds. She underwent a colonoscopy three years ago due to her IBS, and the results were normal.  She suffers from daily migraines and headaches, which are attributed to low iron levels. Additionally, she experiences significant acid reflux and is currently taking Protonix  for management. She is up to date on her GYN exams and physicals but needs to schedule a mammogram this year.   MEDICAL HISTORY:  Past Medical History:  Diagnosis Date   Chronic constipation    Depression    H/O cardiac murmur    PT DENIES S & S    History of kidney stones    Migraine    Mild acid reflux    DIET CONTROLLED   Right ureteral calculus    Urgency of urination    Vitamin D deficiency    Wears contact lenses     SURGICAL HISTORY: Past Surgical History:  Procedure Laterality Date   CYSTOSCOPY WITH RETROGRADE PYELOGRAM, URETEROSCOPY AND STENT PLACEMENT Right 01/21/2014   Procedure: CYSTOSCOPY WITH RETROGRADE PYELOGRAM, URETEROSCOPY AND  STENT PLACEMENT;  Surgeon: Livingston Rigg, MD;  Location[facility];  Service: Urology;  Laterality: Right;   HOLMIUM LASER APPLICATION Right 01/21/2014   Procedure: HOLMIUM LASER APPLICATION;  Surgeon: Livingston Rigg, MD;  Location: [facility];  Service: Urology;  Laterality: Right;    SOCIAL HISTORY: She reports that she has never smoked. She has never used smokeless tobacco. She reports current alcohol use. She reports that she does not use drugs. Social History   Socioeconomic History   Marital status: Single    Spouse name: Not on file   Number of children: Not  on file   Years of education: Not on file   Highest education level: Not on file  Occupational History   Not on file  Tobacco Use   Smoking status: Never   Smokeless tobacco: Never  Vaping Use   Vaping status: Never Used  Substance and Sexual Activity   Alcohol use: Yes    Comment: Ocassionally    Drug use: No   Sexual activity: Not on file  Other Topics Concern   Not on file  Social History Narrative   Not on file   Social Drivers of Health   Financial Resource Strain: Not on file  Food Insecurity: No Food Insecurity (05/09/2024)   Hunger Vital Sign    Worried About Running Out of Food in the Last Year: Never true    Ran Out of Food in the Last Year: Never true  Transportation Needs: No Transportation Needs (05/09/2024)   PRAPARE - Administrator, Civil Service (Medical): No    Lack of Transportation (Non-Medical): No  Physical Activity: Not on file  Stress: Not on file  Social Connections: Not on file  Intimate Partner Violence: Not At Risk (05/09/2024)   Humiliation, Afraid, Rape, and Kick questionnaire    Fear of Current or Ex-Partner: No  Emotionally Abused: No    Physically Abused: No    Sexually Abused: No    FAMILY HISTORY: Family History  Problem Relation Age of Onset   Hyperlipidemia Mother    Hypertension Mother    Diabetes Mother    Heart Problems Father    Hyperlipidemia Father    Hypertension Father     ALLERGIES:  She is allergic to imitrex [sumatriptan], tape, and topiramate.  MEDICATIONS:  Current Outpatient Medications  Medication Sig Dispense Refill   atorvastatin  (LIPITOR) 10 MG tablet Take 1 tablet (10 mg total) by mouth daily. 90 tablet 3   Biotin w/ Vitamins C & E (HAIR/SKIN/NAILS PO) Take 1 tablet by mouth daily.     buPROPion  (WELLBUTRIN  XL) 150 MG 24 hr tablet Take 1 tablet (150 mg total) by mouth every morning. 90 tablet 3   Cholecalciferol  (VITAMIN D3) 1.25 MG (50000 UT) CAPS Take 1 capsule (50,000 Units total) by  mouth once a week. 12 capsule 3   hydrOXYzine  (ATARAX ) 25 MG tablet Take 1-2 tablets (25-50 mg total) by mouth daily as needed for anxiety. 180 tablet 3   ketorolac  (TORADOL ) 10 MG tablet Take 10 mg by mouth every 6 (six) hours as needed for severe pain.   0   Lactobacillus Rhamnosus, GG, (PROBIOTIC COLIC) LIQD Take 1 Container by mouth daily.     linaclotide  (LINZESS ) 290 MCG CAPS capsule Take 1 capsule (290 mcg total) by mouth daily. 30 capsule 5   ondansetron  (ZOFRAN ) 4 MG tablet Take 1 tablet (4 mg total) by mouth every 8 (eight) hours as needed for nausea or vomiting. 10 tablet 0   pantoprazole  (PROTONIX ) 40 MG tablet Take 1 tablet (40 mg total) by mouth daily. 90 tablet 3   promethazine  (PHENERGAN ) 25 MG tablet Take 1 tablet (25 mg total) by mouth every 8 (eight) hours as needed for nausea 24 tablet 2   QULIPTA  30 MG TABS Take 1 tablet (30 mg total) by mouth daily. 30 tablet 6   Rimegepant Sulfate  (NURTEC) 75 MG TBDP Take 1 tablet (75 mg total) by mouth daily as needed FOR HEADACHE. 8 tablet 11   sertraline  (ZOLOFT ) 100 MG tablet Take 1.5 tablets (150 mg total) by mouth daily. 135 tablet 3   tamsulosin  (FLOMAX ) 0.4 MG CAPS capsule Take 1 capsule (0.4 mg total) by mouth 2 (two) times daily. (Patient taking differently: Take 0.4 mg by mouth daily as needed (Pain).) 10 capsule 0   tiZANidine  (ZANAFLEX ) 4 MG tablet Take 1 tablet (4 mg total) by mouth every 8 (eight) hours as needed. 60 tablet 2   valACYclovir  (VALTREX ) 1000 MG tablet Take 1 tablet (1,000 mg total) by mouth daily. 30 tablet 11   atorvastatin  (LIPITOR) 10 MG tablet Take 10 mg by mouth daily. (Patient not taking: Reported on 05/09/2024)     atorvastatin  (LIPITOR) 10 MG tablet Take 1 tablet (10 mg total) by mouth daily. (Patient not taking: Reported on 05/09/2024) 30 tablet 5   buPROPion  (WELLBUTRIN  XL) 150 MG 24 hr tablet Take 150 mg by mouth daily.  (Patient not taking: Reported on 05/09/2024)     buPROPion  (WELLBUTRIN  XL) 150 MG 24 hr  tablet Take 1 tablet (150 mg total) by mouth in the morning. (Patient not taking: Reported on 05/09/2024) 30 tablet 3   Cholecalciferol  (VITAMIN D3) 1.25 MG (50000 UT) CAPS Take 1 capsule (50,000 Units total) by mouth once a week. (Patient not taking: Reported on 05/09/2024) 4 capsule 5   hydrOXYzine  (ATARAX )  25 MG tablet Take 1-2 tablets (25-50 mg total) by mouth as needed for anxiety and for insomnia. (Patient not taking: Reported on 05/09/2024) 60 tablet 6   sertraline  (ZOLOFT ) 100 MG tablet Take 150 mg by mouth daily. (Patient not taking: Reported on 05/09/2024)     sertraline  (ZOLOFT ) 100 MG tablet Take 1.5 tablets (150 mg total) by mouth daily. (Patient not taking: Reported on 05/09/2024) 135 tablet 3   valACYclovir  (VALTREX ) 1000 MG tablet Take 1 tablet (1,000 mg total) by mouth daily. (Patient not taking: Reported on 05/09/2024) 90 tablet 3   No current facility-administered medications for this visit.    REVIEW OF SYSTEMS:    Review of Systems - Oncology  All other pertinent systems were reviewed and were negative except as mentioned above.  PHYSICAL EXAMINATION:   Onc Performance Status - 05/09/24 1416       ECOG Perf Status   ECOG Perf Status Restricted in physically strenuous activity but ambulatory and able to carry out work of a light or sedentary nature, e.g., light house work, office work      KPS SCALE   KPS % SCORE Normal activity with effort, some s/s of disease             Vitals:   05/09/24 1408  BP: 113/82  Pulse: 83  Resp: 17  Temp: 97.8 F (36.6 C)  SpO2: 100%   Filed Weights   05/09/24 1408  Weight: 144 lb 14.4 oz (65.7 kg)    Physical Exam Constitutional:      General: She is not in acute distress.    Appearance: Normal appearance.  HENT:     Head: Normocephalic and atraumatic.  Cardiovascular:     Rate and Rhythm: Normal rate.     Heart sounds: Normal heart sounds.  Pulmonary:     Effort: Pulmonary effort is normal. No respiratory  distress.     Breath sounds: Normal breath sounds.  Abdominal:     General: There is no distension.  Neurological:     General: No focal deficit present.     Mental Status: She is alert and oriented to person, place, and time.  Psychiatric:        Mood and Affect: Mood normal.        Behavior: Behavior normal.      LABORATORY DATA:   I have reviewed the data as listed.  Results for orders placed or performed in visit on 05/09/24  CBC with Differential (Cancer Center Only)  Result Value Ref Range   WBC Count 4.3 4.0 - 10.5 K/uL   RBC 4.08 3.87 - 5.11 MIL/uL   Hemoglobin 7.6 (L) 12.0 - 15.0 g/dL   HCT 13.0 (L) 86.5 - 78.4 %   MCV 62.0 (L) 80.0 - 100.0 fL   MCH 18.6 (L) 26.0 - 34.0 pg   MCHC 30.0 30.0 - 36.0 g/dL   RDW 69.6 (H) 29.5 - 28.4 %   Platelet Count 493 (H) 150 - 400 K/uL   nRBC 0.0 0.0 - 0.2 %   Neutrophils Relative % 47 %   Neutro Abs 2.1 1.7 - 7.7 K/uL   Lymphocytes Relative 46 %   Lymphs Abs 2.0 0.7 - 4.0 K/uL   Monocytes Relative 5 %   Monocytes Absolute 0.2 0.1 - 1.0 K/uL   Eosinophils Relative 1 %   Eosinophils Absolute 0.0 0.0 - 0.5 K/uL   Basophils Relative 1 %   Basophils Absolute 0.0 0.0 - 0.1 K/uL  Immature Granulocytes 0 %   Abs Immature Granulocytes 0.01 0.00 - 0.07 K/uL  CMP (Cancer Center only)  Result Value Ref Range   Sodium 138 135 - 145 mmol/L   Potassium 3.3 (L) 3.5 - 5.1 mmol/L   Chloride 103 98 - 111 mmol/L   CO2 28 22 - 32 mmol/L   Glucose, Bld 84 70 - 99 mg/dL   BUN 9 6 - 20 mg/dL   Creatinine 1.61 0.96 - 1.00 mg/dL   Calcium  9.4 8.9 - 10.3 mg/dL   Total Protein 8.1 6.5 - 8.1 g/dL   Albumin 4.7 3.5 - 5.0 g/dL   AST 17 15 - 41 U/L   ALT 8 0 - 44 U/L   Alkaline Phosphatase 51 38 - 126 U/L   Total Bilirubin 0.7 0.0 - 1.2 mg/dL   GFR, Estimated >04 >54 mL/min   Anion gap 7 5 - 15    RADIOGRAPHIC STUDIES:  No recent pertinent imaging studies available to review.  Orders Placed This Encounter  Procedures   CMP (Cancer  Center only)    Standing Status:   Future    Number of Occurrences:   1    Expiration Date:   05/09/2025   CBC with Differential (Cancer Center Only)    Standing Status:   Future    Number of Occurrences:   1    Expiration Date:   05/09/2025   Iron and Iron Binding Capacity ([facility] only)    Standing Status:   Future    Number of Occurrences:   1    Expiration Date:   05/09/2025   Ferritin    Standing Status:   Future    Number of Occurrences:   1    Expiration Date:   05/09/2025   Vitamin B12    Standing Status:   Future    Number of Occurrences:   1    Expiration Date:   05/09/2025   Folate    Standing Status:   Future    Number of Occurrences:   1    Expiration Date:   05/09/2025    Future Appointments  Date Time Provider Department Center  05/24/2024 12:40 PM [facility] MR 1 [id number]  08/15/2024  2:30 PM [facility] None  08/15/2024  3:00 PM Antoine Vandermeulen, MD [facility] None     I spent a total of 40 minutes during this encounter with the patient including review of chart and various tests results, discussions about plan of care and coordination of care plan.  This document was completed utilizing speech recognition software. Grammatical errors, random word insertions, pronoun errors, and incomplete sentences are an occasional consequence of this system due to software limitations, ambient noise, and hardware issues. Any formal questions or concerns about the content, text or information contained within the body of this dictation should be directly addressed to the provider for clarification.

## 2024-05-09 NOTE — Assessment & Plan Note (Signed)
 Chronic GERD managed with Protonix , with significant acid reflux symptoms reported.

## 2024-05-09 NOTE — Assessment & Plan Note (Signed)
 Chronic IBS with constipation limiting oral iron supplement use. Managed with Linzess .

## 2024-05-09 NOTE — Assessment & Plan Note (Signed)
 Chronic iron deficiency anemia with hemoglobin at 7.5 g/dL, presenting with fatigue, dizziness, and occasional chest pain. IBS with constipation complicates oral iron supplementation.   Anemia likely due to iron deficiency, supported by pica for ice chips and low iron levels. No heavy menstrual bleeding or other blood loss sources. Normal previous colonoscopy. Recommended IV iron infusion to increase hemoglobin and alleviate symptoms.   Labs today showed hemoglobin of 7.6, MCV decreased at 62.  White count 4200 with normal differential.  Platelet count 493,000, likely reactionary from iron deficiency state.  Iron labs, ferritin are pending from today.  Given recent issues with iron deficiency and inability to tolerate oral iron, we will proceed with IV iron.  We got approval to proceed with Venofer weekly x 5 doses.  Will arrange infusions at our infusion center on [address].  - Coordinate with [employer] for IV iron infusion scheduling.  - Consider blood transfusion if hemoglobin drops below 7 g/dL.  - Reassess blood work in three months to evaluate treatment response.

## 2024-05-10 ENCOUNTER — Telehealth: Payer: Self-pay

## 2024-05-10 NOTE — Telephone Encounter (Signed)
 Dr. Pasam, patient will be scheduled as soon as possible.  Auth Submission: NO AUTH NEEDED Site of care: Site of care: [facility] Payer: [employer] commercial Medication & CPT/J Code(s) submitted: Venofer (Iron Sucrose) J1756 Route of submission (phone, fax, portal):  Phone # Fax # Auth type: Buy/Bill PB Units/visits requested: 200mg  x 5 doses Reference number:  Approval from: 05/10/24 to 09/09/24

## 2024-05-14 ENCOUNTER — Other Ambulatory Visit: Payer: Self-pay | Admitting: Oncology

## 2024-05-14 ENCOUNTER — Encounter: Payer: Self-pay | Admitting: Oncology

## 2024-05-23 ENCOUNTER — Ambulatory Visit (INDEPENDENT_AMBULATORY_CARE_PROVIDER_SITE_OTHER)

## 2024-05-23 VITALS — BP 105/63 | HR 62 | Temp 98.4°F | Resp 16 | Ht 62.0 in | Wt 145.4 lb

## 2024-05-23 DIAGNOSIS — K581 Irritable bowel syndrome with constipation: Secondary | ICD-10-CM | POA: Diagnosis not present

## 2024-05-23 DIAGNOSIS — D500 Iron deficiency anemia secondary to blood loss (chronic): Secondary | ICD-10-CM

## 2024-05-23 DIAGNOSIS — D509 Iron deficiency anemia, unspecified: Secondary | ICD-10-CM | POA: Diagnosis not present

## 2024-05-23 MED ORDER — IRON SUCROSE 20 MG/ML IV SOLN
200.0000 mg | INTRAVENOUS | Status: DC
  Administered 2024-05-23: 200 mg via INTRAVENOUS
  Filled 2024-05-23: qty 10

## 2024-05-23 MED ORDER — DIPHENHYDRAMINE HCL 25 MG PO CAPS
25.0000 mg | ORAL_CAPSULE | Freq: Once | ORAL | Status: AC
  Administered 2024-05-23: 25 mg via ORAL
  Filled 2024-05-23: qty 1

## 2024-05-23 MED ORDER — ACETAMINOPHEN 325 MG PO TABS
650.0000 mg | ORAL_TABLET | Freq: Once | ORAL | Status: AC
  Administered 2024-05-23: 650 mg via ORAL
  Filled 2024-05-23: qty 2

## 2024-05-23 NOTE — Patient Instructions (Signed)

## 2024-05-23 NOTE — Progress Notes (Signed)
 Diagnosis: Iron Deficiency Anemia  Provider:  Chilton Greathouse MD  Procedure: IV Push  IV Type: Peripheral, IV Location: R Antecubital  Venofer (Iron Sucrose), Dose: 200 mg  Post Infusion IV Care: Observation period completed and Peripheral IV Discontinued  Discharge: Condition: Good, Destination: Home . AVS Declined  Performed by:  Rico Ala, LPN

## 2024-05-24 ENCOUNTER — Other Ambulatory Visit

## 2024-05-24 ENCOUNTER — Other Ambulatory Visit (HOSPITAL_COMMUNITY): Payer: Self-pay

## 2024-05-26 ENCOUNTER — Other Ambulatory Visit: Payer: Self-pay

## 2024-05-27 ENCOUNTER — Other Ambulatory Visit (HOSPITAL_COMMUNITY): Payer: Self-pay

## 2024-05-27 ENCOUNTER — Other Ambulatory Visit: Payer: Self-pay

## 2024-05-27 MED ORDER — KETOROLAC TROMETHAMINE 10 MG PO TABS
10.0000 mg | ORAL_TABLET | Freq: Four times a day (QID) | ORAL | 2 refills | Status: AC | PRN
  Filled 2024-05-27: qty 10, 3d supply, fill #0
  Filled 2024-10-20: qty 10, 3d supply, fill #1
  Filled 2024-12-17: qty 10, 3d supply, fill #2

## 2024-05-28 ENCOUNTER — Other Ambulatory Visit: Payer: Self-pay | Admitting: Oncology

## 2024-05-30 ENCOUNTER — Ambulatory Visit (INDEPENDENT_AMBULATORY_CARE_PROVIDER_SITE_OTHER)

## 2024-05-30 ENCOUNTER — Encounter: Payer: Self-pay | Admitting: Oncology

## 2024-05-30 VITALS — BP 138/79 | HR 69 | Temp 98.1°F | Resp 18 | Ht 62.0 in | Wt 139.2 lb

## 2024-05-30 DIAGNOSIS — D509 Iron deficiency anemia, unspecified: Secondary | ICD-10-CM

## 2024-05-30 DIAGNOSIS — D500 Iron deficiency anemia secondary to blood loss (chronic): Secondary | ICD-10-CM

## 2024-05-30 DIAGNOSIS — K581 Irritable bowel syndrome with constipation: Secondary | ICD-10-CM | POA: Diagnosis not present

## 2024-05-30 MED ORDER — FAMOTIDINE IN NACL 20-0.9 MG/50ML-% IV SOLN
20.0000 mg | Freq: Once | INTRAVENOUS | Status: AC
  Administered 2024-05-30: 20 mg via INTRAVENOUS

## 2024-05-30 MED ORDER — IRON SUCROSE 20 MG/ML IV SOLN
200.0000 mg | INTRAVENOUS | Status: DC
  Administered 2024-05-30: 200 mg via INTRAVENOUS
  Filled 2024-05-30: qty 10

## 2024-05-30 MED ORDER — ACETAMINOPHEN 325 MG PO TABS
650.0000 mg | ORAL_TABLET | Freq: Once | ORAL | Status: AC
  Administered 2024-05-30: 650 mg via ORAL
  Filled 2024-05-30: qty 2

## 2024-05-30 MED ORDER — METHYLPREDNISOLONE SODIUM SUCC 125 MG IJ SOLR
125.0000 mg | Freq: Once | INTRAMUSCULAR | Status: AC
  Administered 2024-05-30: 125 mg via INTRAVENOUS
  Filled 2024-05-30: qty 2

## 2024-05-30 MED ORDER — DIPHENHYDRAMINE HCL 25 MG PO CAPS
25.0000 mg | ORAL_CAPSULE | Freq: Once | ORAL | Status: AC
  Administered 2024-05-30: 25 mg via ORAL
  Filled 2024-05-30: qty 1

## 2024-05-30 MED ORDER — FAMOTIDINE 20 MG PO TABS
20.0000 mg | ORAL_TABLET | Freq: Once | ORAL | Status: DC
  Filled 2024-05-30: qty 1

## 2024-05-30 NOTE — Progress Notes (Cosign Needed)
 Diagnosis: Iron Deficiency Anemia  Provider:  Chilton Greathouse MD  Procedure: IV Push  IV Type: Peripheral, IV Location: L Antecubital  Venofer (Iron Sucrose), Dose: 200 mg  Post Infusion IV Care: Observation period completed and Peripheral IV Discontinued  Discharge: Condition: Good, Destination: Home . AVS Declined  Performed by:  Rico Ala, LPN

## 2024-06-06 ENCOUNTER — Ambulatory Visit (INDEPENDENT_AMBULATORY_CARE_PROVIDER_SITE_OTHER)

## 2024-06-06 VITALS — BP 135/91 | HR 67 | Temp 97.9°F | Resp 16 | Ht 62.0 in | Wt 138.2 lb

## 2024-06-06 DIAGNOSIS — D509 Iron deficiency anemia, unspecified: Secondary | ICD-10-CM

## 2024-06-06 DIAGNOSIS — D500 Iron deficiency anemia secondary to blood loss (chronic): Secondary | ICD-10-CM

## 2024-06-06 DIAGNOSIS — K581 Irritable bowel syndrome with constipation: Secondary | ICD-10-CM | POA: Diagnosis not present

## 2024-06-06 MED ORDER — DIPHENHYDRAMINE HCL 25 MG PO CAPS
25.0000 mg | ORAL_CAPSULE | Freq: Once | ORAL | Status: AC
  Administered 2024-06-06: 25 mg via ORAL
  Filled 2024-06-06: qty 1

## 2024-06-06 MED ORDER — IRON SUCROSE 20 MG/ML IV SOLN
200.0000 mg | INTRAVENOUS | Status: DC
  Administered 2024-06-06: 200 mg via INTRAVENOUS
  Filled 2024-06-06: qty 10

## 2024-06-06 MED ORDER — SODIUM CHLORIDE 0.9 % IV BOLUS
250.0000 mL | Freq: Once | INTRAVENOUS | Status: DC
  Filled 2024-06-06: qty 250

## 2024-06-06 MED ORDER — ACETAMINOPHEN 325 MG PO TABS
650.0000 mg | ORAL_TABLET | Freq: Once | ORAL | Status: AC
  Administered 2024-06-06: 650 mg via ORAL
  Filled 2024-06-06: qty 2

## 2024-06-06 MED ORDER — FAMOTIDINE IN NACL 20-0.9 MG/50ML-% IV SOLN
20.0000 mg | Freq: Once | INTRAVENOUS | Status: AC
  Administered 2024-06-06: 20 mg via INTRAVENOUS
  Filled 2024-06-06: qty 50

## 2024-06-06 MED ORDER — METHYLPREDNISOLONE SODIUM SUCC 125 MG IJ SOLR
125.0000 mg | Freq: Once | INTRAMUSCULAR | Status: AC
  Administered 2024-06-06: 125 mg via INTRAVENOUS
  Filled 2024-06-06: qty 2

## 2024-06-06 NOTE — Progress Notes (Signed)
 Diagnosis: Iron  Deficiency Anemia  Provider:  Mannam, Praveen MD  Procedure: IV Infusion  IV Type: Peripheral, IV Location: R Antecubital  Pepcid  (famotidine ), Dose: 20 mg  Infusion Start Time: 1548  Infusion Stop Time: 1619  Venofer  (Iron  Sucrose), Dose: 200 mg  Infusion Time: 1622   Post Infusion IV Care: Observation period completed and Peripheral IV Discontinued  Discharge: Condition: Good, Destination: Home . AVS Provided  Performed by:  Rocky FORBES Sar, RN

## 2024-06-14 ENCOUNTER — Other Ambulatory Visit (HOSPITAL_COMMUNITY): Payer: Self-pay

## 2024-06-16 ENCOUNTER — Other Ambulatory Visit (HOSPITAL_COMMUNITY): Payer: Self-pay

## 2024-06-20 ENCOUNTER — Ambulatory Visit (INDEPENDENT_AMBULATORY_CARE_PROVIDER_SITE_OTHER)

## 2024-06-20 VITALS — BP 138/87 | HR 55 | Temp 98.9°F | Resp 16 | Ht 62.0 in | Wt 141.0 lb

## 2024-06-20 DIAGNOSIS — D509 Iron deficiency anemia, unspecified: Secondary | ICD-10-CM | POA: Diagnosis not present

## 2024-06-20 DIAGNOSIS — D500 Iron deficiency anemia secondary to blood loss (chronic): Secondary | ICD-10-CM

## 2024-06-20 MED ORDER — METHYLPREDNISOLONE SODIUM SUCC 125 MG IJ SOLR
125.0000 mg | Freq: Once | INTRAMUSCULAR | Status: AC
  Administered 2024-06-20: 125 mg via INTRAVENOUS
  Filled 2024-06-20: qty 2

## 2024-06-20 MED ORDER — FAMOTIDINE IN NACL 20-0.9 MG/50ML-% IV SOLN
20.0000 mg | Freq: Once | INTRAVENOUS | Status: AC
  Administered 2024-06-20: 20 mg via INTRAVENOUS
  Filled 2024-06-20: qty 50

## 2024-06-20 MED ORDER — DIPHENHYDRAMINE HCL 25 MG PO CAPS
25.0000 mg | ORAL_CAPSULE | Freq: Once | ORAL | Status: AC
  Administered 2024-06-20: 25 mg via ORAL
  Filled 2024-06-20: qty 1

## 2024-06-20 MED ORDER — SODIUM CHLORIDE 0.9 % IV BOLUS
250.0000 mL | Freq: Once | INTRAVENOUS | Status: DC
  Filled 2024-06-20: qty 250

## 2024-06-20 MED ORDER — IRON SUCROSE 20 MG/ML IV SOLN
200.0000 mg | INTRAVENOUS | Status: DC
  Administered 2024-06-20: 200 mg via INTRAVENOUS
  Filled 2024-06-20: qty 10

## 2024-06-20 MED ORDER — ACETAMINOPHEN 325 MG PO TABS
650.0000 mg | ORAL_TABLET | Freq: Once | ORAL | Status: AC
  Administered 2024-06-20: 650 mg via ORAL
  Filled 2024-06-20: qty 2

## 2024-06-20 NOTE — Progress Notes (Signed)
 Diagnosis: Iron Deficiency Anemia  Provider:  Chilton Greathouse MD  Procedure: IV Push  IV Type: Peripheral, IV Location: R Antecubital  Venofer (Iron Sucrose), Dose: 200 mg  Post Infusion IV Care: Observation period completed and Peripheral IV Discontinued  Discharge: Condition: Good, Destination: Home . AVS Declined  Performed by:  Rico Ala, LPN

## 2024-06-27 ENCOUNTER — Ambulatory Visit (INDEPENDENT_AMBULATORY_CARE_PROVIDER_SITE_OTHER)

## 2024-06-27 VITALS — BP 157/83 | HR 63 | Temp 98.0°F | Resp 16 | Ht 62.0 in | Wt 137.6 lb

## 2024-06-27 DIAGNOSIS — D509 Iron deficiency anemia, unspecified: Secondary | ICD-10-CM | POA: Diagnosis not present

## 2024-06-27 DIAGNOSIS — D500 Iron deficiency anemia secondary to blood loss (chronic): Secondary | ICD-10-CM

## 2024-06-27 MED ORDER — SODIUM CHLORIDE 0.9 % IV BOLUS
250.0000 mL | Freq: Once | INTRAVENOUS | Status: AC
  Administered 2024-06-27: 250 mL via INTRAVENOUS
  Filled 2024-06-27: qty 250

## 2024-06-27 MED ORDER — DIPHENHYDRAMINE HCL 25 MG PO CAPS
25.0000 mg | ORAL_CAPSULE | Freq: Once | ORAL | Status: AC
  Administered 2024-06-27: 25 mg via ORAL
  Filled 2024-06-27: qty 1

## 2024-06-27 MED ORDER — FAMOTIDINE IN NACL 20-0.9 MG/50ML-% IV SOLN
20.0000 mg | Freq: Once | INTRAVENOUS | Status: AC
  Administered 2024-06-27: 20 mg via INTRAVENOUS
  Filled 2024-06-27: qty 50

## 2024-06-27 MED ORDER — ACETAMINOPHEN 325 MG PO TABS
650.0000 mg | ORAL_TABLET | Freq: Once | ORAL | Status: AC
  Administered 2024-06-27: 650 mg via ORAL
  Filled 2024-06-27: qty 2

## 2024-06-27 MED ORDER — IRON SUCROSE 20 MG/ML IV SOLN
200.0000 mg | INTRAVENOUS | Status: DC
  Administered 2024-06-27: 200 mg via INTRAVENOUS
  Filled 2024-06-27: qty 10

## 2024-06-27 MED ORDER — METHYLPREDNISOLONE SODIUM SUCC 125 MG IJ SOLR
125.0000 mg | Freq: Once | INTRAMUSCULAR | Status: AC
  Administered 2024-06-27: 125 mg via INTRAVENOUS
  Filled 2024-06-27: qty 2

## 2024-06-27 NOTE — Progress Notes (Signed)
 Diagnosis: Iron  Deficiency Anemia  Provider:  Lonna Coder MD  Procedure: IV Infusion  IV Type: Peripheral, IV Location: L Antecubital  Pepcid  (Famotidine ), Dose: 20 mg  Infusion Start Time: 1608  Infusion Stop Time: 1634   Procedure: IV Push  IV Type: Peripheral, IV Location: L Antecubital  Venofer  (Iron  Sucrose), Dose: 200 mg  Post Infusion IV Care: Patient declined observation  Discharge: Condition: Good, Destination: Home . AVS Declined  Performed by:  Leita FORBES Miles, LPN

## 2024-07-01 ENCOUNTER — Other Ambulatory Visit (HOSPITAL_COMMUNITY): Payer: Self-pay

## 2024-07-02 ENCOUNTER — Encounter: Payer: Self-pay | Admitting: Oncology

## 2024-07-02 ENCOUNTER — Encounter (HOSPITAL_COMMUNITY): Payer: Self-pay

## 2024-07-02 ENCOUNTER — Other Ambulatory Visit: Payer: Self-pay

## 2024-07-02 ENCOUNTER — Other Ambulatory Visit (HOSPITAL_COMMUNITY): Payer: Self-pay

## 2024-07-02 DIAGNOSIS — G43019 Migraine without aura, intractable, without status migrainosus: Secondary | ICD-10-CM | POA: Diagnosis not present

## 2024-07-02 MED ORDER — MEMANTINE HCL 5 MG PO TABS
ORAL_TABLET | ORAL | 0 refills | Status: DC
  Filled 2024-07-02: qty 120, 40d supply, fill #0

## 2024-07-02 MED ORDER — NURTEC 75 MG PO TBDP
75.0000 mg | ORAL_TABLET | Freq: Every day | ORAL | 11 refills | Status: AC | PRN
  Filled 2024-07-02 (×2): qty 8, 30d supply, fill #0

## 2024-07-03 ENCOUNTER — Other Ambulatory Visit (HOSPITAL_COMMUNITY): Payer: Self-pay

## 2024-07-05 ENCOUNTER — Other Ambulatory Visit (HOSPITAL_COMMUNITY): Payer: Self-pay

## 2024-07-07 ENCOUNTER — Other Ambulatory Visit: Payer: Self-pay

## 2024-07-19 DIAGNOSIS — Z1231 Encounter for screening mammogram for malignant neoplasm of breast: Secondary | ICD-10-CM | POA: Diagnosis not present

## 2024-08-04 ENCOUNTER — Encounter: Payer: Self-pay | Admitting: Oncology

## 2024-08-07 ENCOUNTER — Other Ambulatory Visit: Payer: Self-pay

## 2024-08-07 ENCOUNTER — Encounter: Payer: Self-pay | Admitting: Oncology

## 2024-08-07 ENCOUNTER — Other Ambulatory Visit: Payer: Self-pay | Admitting: Oncology

## 2024-08-07 ENCOUNTER — Other Ambulatory Visit (HOSPITAL_COMMUNITY): Payer: Self-pay

## 2024-08-07 DIAGNOSIS — D500 Iron deficiency anemia secondary to blood loss (chronic): Secondary | ICD-10-CM

## 2024-08-07 NOTE — Progress Notes (Signed)
 Opened in error

## 2024-08-15 ENCOUNTER — Ambulatory Visit: Admitting: Oncology

## 2024-08-15 ENCOUNTER — Other Ambulatory Visit

## 2024-08-20 ENCOUNTER — Other Ambulatory Visit: Payer: Self-pay

## 2024-08-20 ENCOUNTER — Other Ambulatory Visit (HOSPITAL_COMMUNITY): Payer: Self-pay

## 2024-08-20 MED ORDER — BUPROPION HCL ER (XL) 300 MG PO TB24
300.0000 mg | ORAL_TABLET | Freq: Every morning | ORAL | 1 refills | Status: AC
  Filled 2024-08-20 – 2024-08-31 (×2): qty 30, 30d supply, fill #0
  Filled 2024-10-20: qty 30, 30d supply, fill #1

## 2024-08-21 ENCOUNTER — Encounter: Payer: Self-pay | Admitting: Pharmacist

## 2024-08-21 ENCOUNTER — Other Ambulatory Visit: Payer: Self-pay

## 2024-08-26 ENCOUNTER — Other Ambulatory Visit: Payer: Self-pay

## 2024-08-31 ENCOUNTER — Other Ambulatory Visit (HOSPITAL_COMMUNITY): Payer: Self-pay

## 2024-09-01 ENCOUNTER — Other Ambulatory Visit: Payer: Self-pay

## 2024-09-15 ENCOUNTER — Other Ambulatory Visit (HOSPITAL_COMMUNITY): Payer: Self-pay

## 2024-09-15 DIAGNOSIS — F5101 Primary insomnia: Secondary | ICD-10-CM | POA: Diagnosis not present

## 2024-09-15 DIAGNOSIS — F419 Anxiety disorder, unspecified: Secondary | ICD-10-CM | POA: Diagnosis not present

## 2024-09-15 DIAGNOSIS — E611 Iron deficiency: Secondary | ICD-10-CM | POA: Diagnosis not present

## 2024-09-15 DIAGNOSIS — F331 Major depressive disorder, recurrent, moderate: Secondary | ICD-10-CM | POA: Diagnosis not present

## 2024-09-15 DIAGNOSIS — G43109 Migraine with aura, not intractable, without status migrainosus: Secondary | ICD-10-CM | POA: Diagnosis not present

## 2024-09-15 MED ORDER — BUPROPION HCL ER (XL) 300 MG PO TB24
300.0000 mg | ORAL_TABLET | Freq: Every morning | ORAL | 5 refills | Status: AC
  Filled 2024-09-15 – 2024-12-17 (×2): qty 30, 30d supply, fill #0

## 2024-09-16 ENCOUNTER — Other Ambulatory Visit (HOSPITAL_COMMUNITY): Payer: Self-pay

## 2024-09-17 ENCOUNTER — Other Ambulatory Visit: Payer: Self-pay

## 2024-09-17 ENCOUNTER — Other Ambulatory Visit (HOSPITAL_COMMUNITY): Payer: Self-pay

## 2024-09-17 DIAGNOSIS — G43719 Chronic migraine without aura, intractable, without status migrainosus: Secondary | ICD-10-CM | POA: Diagnosis not present

## 2024-09-17 MED ORDER — MEMANTINE HCL 10 MG PO TABS
10.0000 mg | ORAL_TABLET | Freq: Two times a day (BID) | ORAL | 1 refills | Status: AC
  Filled 2024-09-17 – 2024-10-20 (×2): qty 180, 90d supply, fill #0

## 2024-09-18 ENCOUNTER — Other Ambulatory Visit: Payer: Self-pay

## 2024-09-18 ENCOUNTER — Encounter: Payer: Self-pay | Admitting: Pharmacist

## 2024-09-23 ENCOUNTER — Other Ambulatory Visit: Payer: Self-pay

## 2024-10-21 ENCOUNTER — Other Ambulatory Visit: Payer: Self-pay

## 2024-10-21 ENCOUNTER — Other Ambulatory Visit (HOSPITAL_COMMUNITY): Payer: Self-pay

## 2024-12-17 ENCOUNTER — Other Ambulatory Visit (HOSPITAL_COMMUNITY): Payer: Self-pay

## 2024-12-17 ENCOUNTER — Other Ambulatory Visit: Payer: Self-pay
